# Patient Record
Sex: Male | Born: 1945 | Race: White | Hispanic: No | Marital: Married
Health system: Midwestern US, Community
[De-identification: ages and names within clinical notes are randomized; demographics above are authoritative.]

## PROBLEM LIST (undated history)

## (undated) DIAGNOSIS — K635 Polyp of colon: Secondary | ICD-10-CM

## (undated) DIAGNOSIS — R7989 Other specified abnormal findings of blood chemistry: Secondary | ICD-10-CM

## (undated) DIAGNOSIS — F419 Anxiety disorder, unspecified: Secondary | ICD-10-CM

## (undated) DIAGNOSIS — F32A Depression, unspecified: Secondary | ICD-10-CM

## (undated) DIAGNOSIS — C3490 Malignant neoplasm of unspecified part of unspecified bronchus or lung: Secondary | ICD-10-CM

## (undated) DIAGNOSIS — K8020 Calculus of gallbladder without cholecystitis without obstruction: Secondary | ICD-10-CM

## (undated) DIAGNOSIS — E785 Hyperlipidemia, unspecified: Secondary | ICD-10-CM

## (undated) DIAGNOSIS — C189 Malignant neoplasm of colon, unspecified: Secondary | ICD-10-CM

## (undated) DIAGNOSIS — K219 Gastro-esophageal reflux disease without esophagitis: Secondary | ICD-10-CM

## (undated) DIAGNOSIS — C801 Malignant (primary) neoplasm, unspecified: Secondary | ICD-10-CM

## (undated) DIAGNOSIS — K56609 Unspecified intestinal obstruction, unspecified as to partial versus complete obstruction: Secondary | ICD-10-CM

## (undated) DIAGNOSIS — I10 Essential (primary) hypertension: Secondary | ICD-10-CM

## (undated) DIAGNOSIS — M1990 Unspecified osteoarthritis, unspecified site: Secondary | ICD-10-CM

## (undated) HISTORY — PX: APPENDECTOMY: SHX54

## (undated) HISTORY — DX: Polyp of colon: K63.5

## (undated) HISTORY — PX: CHOLECYSTECTOMY: SHX55

## (undated) HISTORY — DX: Unspecified osteoarthritis, unspecified site: M19.90

## (undated) HISTORY — PX: COLON SURGERY: SHX602

## (undated) HISTORY — DX: Depression, unspecified: F32.A

## (undated) HISTORY — DX: Hyperlipidemia, unspecified: E78.5

## (undated) HISTORY — DX: Malignant neoplasm of colon, unspecified: C18.9

## (undated) HISTORY — DX: Calculus of gallbladder without cholecystitis without obstruction: K80.20

## (undated) HISTORY — PX: ANKLE FUSION: SHX881

## (undated) HISTORY — DX: Gastro-esophageal reflux disease without esophagitis: K21.9

## (undated) HISTORY — DX: Other specified abnormal findings of blood chemistry: R79.89

## (undated) HISTORY — PX: PROSTATE SURGERY: SHX751

## (undated) HISTORY — DX: Malignant neoplasm of unspecified part of unspecified bronchus or lung: C34.90

## (undated) HISTORY — DX: Anxiety disorder, unspecified: F41.9

## (undated) HISTORY — DX: Unspecified intestinal obstruction, unspecified as to partial versus complete obstruction: K56.609

---

## 2002-10-25 DIAGNOSIS — Z85038 Personal history of other malignant neoplasm of large intestine: Secondary | ICD-10-CM | POA: Insufficient documentation

## 2010-09-30 DIAGNOSIS — E538 Deficiency of other specified B group vitamins: Secondary | ICD-10-CM | POA: Insufficient documentation

## 2011-05-24 DIAGNOSIS — C189 Malignant neoplasm of colon, unspecified: Secondary | ICD-10-CM | POA: Insufficient documentation

## 2011-05-24 DIAGNOSIS — C7800 Secondary malignant neoplasm of unspecified lung: Secondary | ICD-10-CM | POA: Insufficient documentation

## 2012-03-08 DIAGNOSIS — E7800 Pure hypercholesterolemia, unspecified: Secondary | ICD-10-CM | POA: Insufficient documentation

## 2017-03-29 ENCOUNTER — Encounter (HOSPITAL_COMMUNITY): Payer: Self-pay

## 2017-03-29 ENCOUNTER — Emergency Department (HOSPITAL_COMMUNITY)
Admission: EM | Admit: 2017-03-29 | Discharge: 2017-03-29 | Disposition: A | Discharge status: Home / Self Care | Payer: Medicare Other | Attending: Emergency Medicine | Admitting: Emergency Medicine

## 2017-03-29 DIAGNOSIS — Z859 Personal history of malignant neoplasm, unspecified: Secondary | ICD-10-CM | POA: Insufficient documentation

## 2017-03-29 DIAGNOSIS — I10 Essential (primary) hypertension: Secondary | ICD-10-CM | POA: Insufficient documentation

## 2017-03-29 DIAGNOSIS — I83811 Varicose veins of right lower extremities with pain: Secondary | ICD-10-CM | POA: Insufficient documentation

## 2017-03-29 DIAGNOSIS — I83899 Varicose veins of unspecified lower extremities with other complications: Secondary | ICD-10-CM

## 2017-03-29 HISTORY — DX: Essential (primary) hypertension: I10

## 2017-03-29 HISTORY — DX: Malignant (primary) neoplasm, unspecified: C80.1

## 2017-03-29 MED ORDER — LIDOCAINE-EPINEPHRINE (PF) 2 %-1:200000 IJ SOLN
10.0000 mL | Freq: Once | INTRAMUSCULAR | Status: AC
  Administered 2017-03-29: 10 mL via INTRADERMAL
  Filled 2017-03-29: qty 20

## 2017-03-29 NOTE — ED Triage Notes (Signed)
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 No pain

## 2017-03-29 NOTE — ED Provider Notes (Signed)
[facility] DEPT Provider Note   CSN: [id number] Arrival date & time: 03/29/17  2129   By signing my name below, I, Jesus [name], attest that this documentation has been prepared under the direction and in the presence of Virgel Manifold, MD . Electronically Signed: Evelene [name], Scribe. 03/29/2017. 9:58 PM.   History   Chief Complaint Chief Complaint  Patient presents with  . Vein bleeding    The history is provided by the patient. No language interpreter was used.     HPI Comments:  Jesus [name] is a 71 y.o. male who presents to the Emergency Department complaining of persistent bleeding from a vein to the right lower leg which began this evening. He states he scratched his leg and thought he scratched a scab but noticed it was profusely bleeding; pt describes squirting of blood from the site. Pt denies h/o similar incident. No alleviating factors or associated symptoms noted.  Past Medical History:  Diagnosis Date  . Cancer ([place])   . Hypertension     There are no active problems to display for this patient.   History reviewed. No pertinent surgical history.     Home Medications    Prior to Admission medications   Not on File    Family History No family history on file.  Social History Social History  Substance Use Topics  . Smoking status: Never Smoker  . Smokeless tobacco: Never Used  . Alcohol use No     Allergies   Patient has no known allergies.   Review of Systems Review of Systems  Constitutional: Negative for chills and fever.  Respiratory: Negative for shortness of breath.   Cardiovascular: Negative for chest pain.  Skin: Positive for wound.  All other systems reviewed and are negative.    Physical Exam Updated Vital Signs SpO2 99%   Physical Exam  Constitutional: He is oriented to person, place, and time. He appears well-developed and well-nourished.  HENT:  Head: Normocephalic and atraumatic.  Eyes: EOM are normal.    Neck: Normal range of motion.  Cardiovascular: Normal rate.   Pulmonary/Chest: Effort normal. No respiratory distress.  Abdominal: He exhibits no distension.  Musculoskeletal: Normal range of motion.  Neurological: He is alert and oriented to person, place, and time.  Skin: Skin is warm and dry.  Continuous flow of blood from a superificial varicosity on the right shin; dark appearing venous blood, non-pulsatile   Psychiatric: He has a normal mood and affect. Judgment normal.  Nursing note and vitals reviewed.    ED Treatments / Results  DIAGNOSTIC STUDIES:  Oxygen Saturation is 99% on RA, normal by my interpretation.    COORDINATION OF CARE:  9:41 PM Discussed treatment plan with pt at bedside and pt agreed to plan.  Labs (all labs ordered are listed, but only abnormal results are displayed) Labs Reviewed - No data to display  EKG  EKG Interpretation None       Radiology No results found.  Procedures .Marland KitchenLaceration Repair Date/Time: 03/29/2017 9:49 PM Performed by: Virgel Manifold Authorized by: Virgel Manifold   Consent:    Consent obtained:  Verbal   Consent given by:  Patient Anesthesia (see MAR for exact dosages):    Anesthesia method:  Local infiltration   Local anesthetic:  Lidocaine 2% WITH epi (2 ccs) Laceration details:    Location:  Leg   Leg location:  R lower leg Skin repair:    Repair method:  Sutures   Suture size:  3-0  Wound skin closure material used: vicryl    Suture technique:  Figure eight   Number of sutures:  1 Post-procedure details:    Patient tolerance of procedure:  Tolerated well, no immediate complications   (including critical care time)  Medications Ordered in ED Medications - No data to display   Initial Impression / Assessment and Plan / ED Course  I have reviewed the triage vital signs and the nursing notes.  Pertinent labs & imaging results that were available during my care of the patient were reviewed by me and  considered in my medical decision making (see chart for details).     21yM with bleeding from superficial varicose vein. Hemostasis by suturing. Wound care and return precautions discussed.   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 The recorded information has been reviewed is accurate. Virgel Manifold, MD.     Virgel Manifold, MD 03/29/17 2206

## 2018-08-02 ENCOUNTER — Encounter: Payer: Self-pay | Admitting: Family Medicine

## 2019-04-16 ENCOUNTER — Telehealth: Payer: Self-pay | Admitting: Adult Health

## 2019-04-16 NOTE — Telephone Encounter (Signed)
Pt would like to have an establish appointment with [facility] but would like to have a face to face declined to do a virtual.  Pt would like to have a call back to get on the books as soon as possible.  Pt state that he would like to have a CPE on the first visit he is aware that we can not garuntee him a CPE on his first visit it is up to the provider.

## 2019-04-17 NOTE — Telephone Encounter (Signed)
Spoke to the pt.  There was some confusion.  Pt would like to establish care with Dr. Yong Channel.  States his brother Galileo Colello) is a pt of Dr. Yong Channel.  Pt is currently moving to [place] from [place].  Notified pt that I will send a message to Dr. Ansel Bong office.

## 2019-04-17 NOTE — Telephone Encounter (Signed)
Patient has been scheduled for 06/18/19 at 11am.

## 2019-04-25 ENCOUNTER — Telehealth: Payer: Self-pay

## 2019-04-25 NOTE — Telephone Encounter (Signed)
Pt is requesting to establish care with Dr. Sarajane Jews. Please advise. Thanks!

## 2019-04-25 NOTE — Telephone Encounter (Signed)
Dr. Sarajane Jews please advise if you are willing to see this pt.  Thanks

## 2019-04-26 NOTE — Telephone Encounter (Signed)
Sorry but I am too full

## 2019-05-11 NOTE — Telephone Encounter (Signed)
Pt has been scheduled with Dr. Yong Channel.

## 2019-06-11 ENCOUNTER — Other Ambulatory Visit: Payer: Self-pay

## 2019-06-11 ENCOUNTER — Telehealth: Payer: Self-pay

## 2019-06-11 NOTE — Telephone Encounter (Signed)
If pt cannot be seen before insurance 06/26/2019, what is the soonest we can work pt in for NP appointment. Look like you have a lot of same day appt. Right now.    Please advise

## 2019-06-11 NOTE — Telephone Encounter (Signed)
While I am willing to accept patient as he is brother of my current patient- I unfortunately am limited in my ability to see new patients due to need to have openings for my current patients.  When is my next available 40-minute slot?  We do have multiple other providers within [facility] who are accepting patients and can see him sooner if he would prefer to establish with another provider.

## 2019-06-11 NOTE — Telephone Encounter (Signed)
Pt is scheduled for NP appt 8/24 but stated his insurance wont start until 9/1. Pt stated he is going to contact insurance and see if they will allow him to be seen earlier with insurance. If not, pt would like to know soonest he can get in for NP appt. Due to the restrictions for NP appt with [facility], wanted to ask when pt could be moved to if needed. Please advise.

## 2019-06-11 NOTE — Telephone Encounter (Signed)
Noted!   Please see note

## 2019-06-18 ENCOUNTER — Ambulatory Visit: Payer: Medicare Other | Admitting: Family Medicine

## 2019-06-27 ENCOUNTER — Other Ambulatory Visit: Payer: Self-pay | Admitting: Emergency Medicine

## 2019-06-27 DIAGNOSIS — R6889 Other general symptoms and signs: Secondary | ICD-10-CM | POA: Diagnosis not present

## 2019-06-27 DIAGNOSIS — D692 Other nonthrombocytopenic purpura: Secondary | ICD-10-CM | POA: Diagnosis not present

## 2019-06-27 DIAGNOSIS — L570 Actinic keratosis: Secondary | ICD-10-CM | POA: Diagnosis not present

## 2019-06-27 DIAGNOSIS — Z20822 Contact with and (suspected) exposure to covid-19: Secondary | ICD-10-CM

## 2019-06-28 ENCOUNTER — Telehealth: Payer: Self-pay | Admitting: Family Medicine

## 2019-06-28 LAB — NOVEL CORONAVIRUS, NAA: SARS-CoV-2, NAA: NOT DETECTED

## 2019-06-28 NOTE — Telephone Encounter (Signed)
Noted  

## 2019-06-28 NOTE — Telephone Encounter (Signed)
Agree with plan - needs doxy visit if having cough and no covid test.  Algis Greenhouse. [name], MD 06/28/2019 4:11 PM

## 2019-06-28 NOTE — Telephone Encounter (Signed)
Patient is scheduled for NP appt tomorrow 06/28/19. States he has a cough that is not bad and is waiting on his covid test results. Patient is asking to please come into the office for his appt if his test results come back negative. Please call patient as soon as possible and advise.Thank you!

## 2019-06-28 NOTE — Telephone Encounter (Signed)
FYI  Spoke to pt and advised that he will be doing a televisit. Pt was also informed that test results were not in yet. Pt stated that his friend got his test results back today and was neg so he's should be neg. Pt was advised that he would have to test neg and bc his cough was 2-3 days pt will have to do a Doxy.me. Pt stated " I've played golf and went to several doctors appts this week so my temp was checked!". Pt stated I" I do not have a temp so I should be good" I advised pt that he may not develop all the sx and also to self quarantine. Pt stated that no one told him to self quarantine and was a little upset about the appointment situation. Pt was stated that he decided to get the test on his own w/o D.O." just to have it done and have a base line". I advise pt again that bc he now has a cough that a telephone visit will be appropriate. Pt stated that he will keep appt. And wait for Dr. Marigene Ehlers call.

## 2019-06-29 ENCOUNTER — Ambulatory Visit (INDEPENDENT_AMBULATORY_CARE_PROVIDER_SITE_OTHER): Payer: Medicare Other | Admitting: Family Medicine

## 2019-06-29 ENCOUNTER — Other Ambulatory Visit (INDEPENDENT_AMBULATORY_CARE_PROVIDER_SITE_OTHER): Payer: Medicare Other

## 2019-06-29 ENCOUNTER — Other Ambulatory Visit: Payer: Self-pay

## 2019-06-29 ENCOUNTER — Encounter: Payer: Self-pay | Admitting: Family Medicine

## 2019-06-29 VITALS — BP 142/84 | Temp 97.5°F | Ht 71.5 in | Wt 200.0 lb

## 2019-06-29 DIAGNOSIS — K2100 Gastro-esophageal reflux disease with esophagitis, without bleeding: Secondary | ICD-10-CM

## 2019-06-29 DIAGNOSIS — C20 Malignant neoplasm of rectum: Secondary | ICD-10-CM

## 2019-06-29 DIAGNOSIS — C3490 Malignant neoplasm of unspecified part of unspecified bronchus or lung: Secondary | ICD-10-CM

## 2019-06-29 DIAGNOSIS — E785 Hyperlipidemia, unspecified: Secondary | ICD-10-CM | POA: Insufficient documentation

## 2019-06-29 DIAGNOSIS — I10 Essential (primary) hypertension: Secondary | ICD-10-CM | POA: Diagnosis not present

## 2019-06-29 DIAGNOSIS — Z125 Encounter for screening for malignant neoplasm of prostate: Secondary | ICD-10-CM | POA: Diagnosis not present

## 2019-06-29 DIAGNOSIS — Z79899 Other long term (current) drug therapy: Secondary | ICD-10-CM

## 2019-06-29 DIAGNOSIS — K219 Gastro-esophageal reflux disease without esophagitis: Secondary | ICD-10-CM | POA: Insufficient documentation

## 2019-06-29 DIAGNOSIS — F419 Anxiety disorder, unspecified: Secondary | ICD-10-CM

## 2019-06-29 LAB — COMPREHENSIVE METABOLIC PANEL
ALT: 20 U/L (ref 0–53)
AST: 21 U/L (ref 0–37)
Albumin: 3.7 g/dL (ref 3.5–5.2)
Alkaline Phosphatase: 64 U/L (ref 39–117)
BUN: 7 mg/dL (ref 6–23)
CO2: 33 mEq/L — ABNORMAL HIGH (ref 19–32)
Calcium: 9.2 mg/dL (ref 8.4–10.5)
Chloride: 88 mEq/L — ABNORMAL LOW (ref 96–112)
Creatinine, Ser: 0.76 mg/dL (ref 0.40–1.50)
GFR: 100.65 mL/min (ref >60.00)
Glucose, Bld: 116 mg/dL — ABNORMAL HIGH (ref 70–99)
Potassium: 4.6 mEq/L (ref 3.5–5.1)
Sodium: 131 mEq/L — ABNORMAL LOW (ref 135–145)
Total Bilirubin: 0.7 mg/dL (ref 0.2–1.2)
Total Protein: 6.5 g/dL (ref 6.0–8.3)

## 2019-06-29 LAB — LIPID PANEL
Cholesterol: 109 mg/dL (ref 0–200)
HDL: 46.7 mg/dL (ref >39.00)
LDL Cholesterol: 49 mg/dL (ref 0–99)
NonHDL: 62.18
Total CHOL/HDL Ratio: 2
Triglycerides: 67 mg/dL (ref 0.0–149.0)
VLDL: 13.4 mg/dL (ref 0.0–40.0)

## 2019-06-29 LAB — PSA, MEDICARE: PSA: 0.98 ng/ml (ref 0.10–4.00)

## 2019-06-29 LAB — CBC
HCT: 41 % (ref 39.0–52.0)
Hemoglobin: 14.2 g/dL (ref 13.0–17.0)
MCHC: 34.6 g/dL (ref 30.0–36.0)
MCV: 100.1 fl — ABNORMAL HIGH (ref 78.0–100.0)
Platelets: 288 10*3/uL (ref 150.0–400.0)
RBC: 4.1 Mil/uL — ABNORMAL LOW (ref 4.22–5.81)
RDW: 12.7 % (ref 11.5–15.5)
WBC: 11 10*3/uL — ABNORMAL HIGH (ref 4.0–10.5)

## 2019-06-29 LAB — TSH: TSH: 0.27 u[IU]/mL — ABNORMAL LOW (ref 0.35–4.50)

## 2019-06-29 LAB — VITAMIN B12: Vitamin B-12: >1500 pg/mL — ABNORMAL HIGH (ref 211–911)

## 2019-06-29 NOTE — Assessment & Plan Note (Signed)
No signs of recurrence.  Due for repeat colonoscopy 2022.

## 2019-06-29 NOTE — Assessment & Plan Note (Signed)
Continue omeprazole 20 mg daily.  Check CBC, C met, TSH, and B12.

## 2019-06-29 NOTE — Assessment & Plan Note (Signed)
Will need yearly CT scan soon.

## 2019-06-29 NOTE — Assessment & Plan Note (Signed)
Continue Lipitor 10 mg daily.  Check lipid panel next blood draw.

## 2019-06-29 NOTE — Progress Notes (Signed)
Chief Complaint:  Jesus [name] is a 73 y.o. male who presents today for a virtual office visit with a chief complaint of Essential hypetension.   Assessment/Plan:  Essential hypertension Continue HCTZ 25 mg daily and losartan 50 mg daily.  Will come in soon for physical with blood work.  Recheck blood pressure at that time.  Check CBC, C met, and TSH.  Dyslipidemia Continue Lipitor 10 mg daily.  Check lipid panel next blood draw.  GERD (gastroesophageal reflux disease) Continue omeprazole 20 mg daily.  Check CBC, C met, TSH, and B12.  Anxiety Continue Xanax 0.5 mg twice daily as needed.  Rectal cancer ([place]) No signs of recurrence.  Due for repeat colonoscopy 2022.  Lung cancer [facility]) s/p resection in 2010 Will need yearly CT scan soon.  Preventative Healthcare Patient was instructed to return soon for CPE. Check PSA with blood draw. Health Maintenance Due  Topic Date Due  . Hepatitis C Screening  [birth year]  . COLONOSCOPY  11/24/1996     Subjective:  HPI:  His stable, chronic medical conditions are outlined below:  # Essential Hypertension - On HCTZ 32m daily and losartan 573mdaily. Tolerating well without side effects - ROS: No reported chest or shortness of breath  # Dyslipidemia - On lipitor 203maily and tolerating well - ROS: No reported myalgias  # GERD - On prilosec 29m25mily and doing well  # Anxiety - On xanax 0.5mg 71mce daily as needed - Tolerating well without side effects - ROS: No history of SI or HI  # Rectal Cancer s/p resection ~2000 - Due for colonoscopy in 2022  # Lung Cancer s/p resection ~2010 - Gets yearly CT scan - Has occasional dry cough  ROS: Per HPI, otherwise a complete review of systems was negative.   PMH:  The following were reviewed and entered/updated in epic: Past Medical History:  Diagnosis Date  . Arthritis   . Cancer (HCC) [place] Colon cancer (HCC) [place]2000  . Colon polyp   . GERD  (gastroesophageal reflux disease)   . Hyperlipidemia   . Hypertension   . Lung cancer (HCC)[facility]   Patient Active Problem List   Diagnosis Date Noted  . Essential hypertension 06/29/2019  . Dyslipidemia 06/29/2019  . GERD (gastroesophageal reflux disease) 06/29/2019  . Anxiety 06/29/2019  . Lung cancer (HCC)[facility] resection in 2010 06/29/2019  [facility]:  Procedure Laterality Date  . ANKLE FUSION     crushed ankle while in high school  . APPENDECTOMY     1960's    Family History  Problem Relation Age of Onset  . Heart disease Father   . Breast cancer Sister   . Breast cancer Daughter   . Prostate cancer Neg Hx     Medications- reviewed and updated Current Outpatient Medications  Medication Sig Dispense Refill  . ALPRAZolam (XANAX) 0.5 MG tablet Take 1 tablet by mouth 2 (two) times daily as needed.    . ApoMarland Kitchenequorin (PREVAGEN PO) Take by mouth.    . atoMarland Kitchenvastatin (LIPITOR) 20 MG tablet Take 1 tablet by mouth daily.    . B Complex Vitamins (B COMPLEX PO) Take by mouth.    . Cyanocobalamin (VITAMIN B-12 PO) Take by mouth.    . hydrochlorothiazide (HYDRODIURIL) 25 MG tablet Take 1 tablet by mouth daily.    . losMarland Kitchenrtan (COZAAR) 50 MG tablet Take 1 tablet by  mouth daily.    . Multiple Vitamin (MULTIVITAMIN PO) Take by mouth.    . Multiple Vitamins-Minerals (ZINC PO) Take by mouth.    Marland Kitchen omeprazole (PRILOSEC) 20 MG capsule Take 1 capsule by mouth daily.    Marland Kitchen VITAMIN D PO Take 800 Units by mouth.      No current facility-administered medications for this visit.     Allergies-reviewed and updated No Known Allergies  Social History   Socioeconomic History  . Marital status: Married    Spouse name: Not on file  . Number of children: Not on file  . Years of education: Not on file  . Highest education level: Not on file  Occupational History  . Not on file  Social Needs  . Financial resource strain: Not on file  . Food insecurity     Worry: Not on file    Inability: Not on file  . Transportation needs    Medical: Not on file    Non-medical: Not on file  Tobacco Use  . Smoking status: Former Smoker    Quit date: 2010    Years since quitting: 10.6  . Smokeless tobacco: Never Used  Substance and Sexual Activity  . Alcohol use: Yes  . Drug use: No  . Sexual activity: Not on file  Lifestyle  . Physical activity    Days per week: 3 days    Minutes per session: Not on file  . Stress: Not on file  Relationships  . Social Herbalist on phone: Not on file    Gets together: Not on file    Attends religious service: Not on file    Active member of club or organization: Not on file    Attends meetings of clubs or organizations: Not on file    Relationship status: Not on file  Other Topics Concern  . Not on file  Social History Narrative  . Not on file          Objective/Observations  Physical Exam: Gen: NAD, resting comfortably HEENT: EOMI. No scleral icterus CV: No peripheral edema Pulm: Normal work of breathing MSK: No digitial cyanosis. Neck with FROM Skin: No rashes or lesions Neuro: Grossly normal, moves all extremities Psych: Normal affect and thought content  Virtual Visit via Video   I connected with Jesus [name] on 06/29/19 at  9:40 AM EDT by a video enabled telemedicine application and verified that I am speaking with the correct person using two identifiers. I discussed the limitations of evaluation and management by telemedicine and the availability of in person appointments. The patient expressed understanding and agreed to proceed.   Patient location: Home Provider location: [place] participating in the virtual visit: Myself and Patient     Algis Greenhouse. [name], MD 06/29/2019 10:26 AM

## 2019-06-29 NOTE — Assessment & Plan Note (Signed)
Continue Xanax 0.5 mg twice daily as needed.

## 2019-06-29 NOTE — Assessment & Plan Note (Signed)
Continue HCTZ 25 mg daily and losartan 50 mg daily.  Will come in soon for physical with blood work.  Recheck blood pressure at that time.  Check CBC, C met, and TSH.

## 2019-07-03 NOTE — Progress Notes (Signed)
Please inform patient of the following:  Thyroid level is just a bit off. His sodium level is low - I think this is probably due to his HCTZ.  Everything else is within acceptable ranges.  Recommend that he come back in 4-6 weeks to recheck TSH and BMET.  Do not need to make any medication changes today. Please place future orders.   Algis Greenhouse. [name], MD 07/03/2019 8:25 AM

## 2019-07-05 ENCOUNTER — Telehealth: Payer: Self-pay | Admitting: Physical Therapy

## 2019-07-05 ENCOUNTER — Encounter: Payer: Self-pay | Admitting: Family Medicine

## 2019-07-05 ENCOUNTER — Other Ambulatory Visit: Payer: Self-pay | Admitting: Family Medicine

## 2019-07-05 ENCOUNTER — Ambulatory Visit (INDEPENDENT_AMBULATORY_CARE_PROVIDER_SITE_OTHER): Payer: Medicare Other | Admitting: Family Medicine

## 2019-07-05 DIAGNOSIS — K2100 Gastro-esophageal reflux disease with esophagitis, without bleeding: Secondary | ICD-10-CM

## 2019-07-05 DIAGNOSIS — R059 Cough, unspecified: Secondary | ICD-10-CM

## 2019-07-05 DIAGNOSIS — R05 Cough: Secondary | ICD-10-CM | POA: Diagnosis not present

## 2019-07-05 MED ORDER — PANTOPRAZOLE SODIUM 40 MG PO TBEC: 40.0000 mg | DELAYED_RELEASE_TABLET | Freq: Two times a day (BID) | ORAL | 1 refills | Status: DC

## 2019-07-05 MED ORDER — AZITHROMYCIN 250 MG PO TABS
ORAL_TABLET | ORAL | 0 refills | Status: DC
Start: 2019-07-05 — End: 2019-07-30

## 2019-07-05 MED ORDER — GUAIFENESIN-CODEINE 100-10 MG/5ML PO SOLN: 5.0000 mL | Freq: Three times a day (TID) | ORAL | 0 refills | Status: DC | PRN

## 2019-07-05 NOTE — Progress Notes (Signed)
    Chief Complaint:  Jesus Jesus [name] is a 73 y.o. male who presents today for a virtual office visit with a chief complaint of cough.   Assessment/Plan:  Cough Tested for COVID a week ago which was negative.  History not consistent with infectious etiology.  Does have some worsening reflux symptoms which could be the main contributor.  We will start Protonix 40 mg twice daily for the next couple of weeks.  He will also continue taking Claritin.  Also send in guaifenesin-codeine for his cough.  Also send in a "pocket prescription" for azithromycin with strict instruction not start in the symptoms worsen or not improve next few days.  Discussed reasons to return to care.  If symptoms not improved will likely need chest x-ray.  GERD (gastroesophageal reflux disease) Uncontrolled.  Change omeprazole to Protonix 40 mg twice daily for 2 weeks.     Subjective:  HPI:  Cough Started about 7 to 10 days ago.  Tried taking Delsym and emergency with modest improvement.  Symptoms been stable over the last couple of days.  Cough is productive of some mucus.  Jesus [name] fevers or chills.  She had some decreased appetite.  Decreased energy levels.  Is able to play 9 holes of golf however is typically able to play 18 holes of golf.  Jesus [name] known sick contacts.  Jesus [name] other treatments tried.  Jesus [name] other obvious alleviating or aggravating factors.  Has had some worsening with reflux over the past couple of weeks.  Is currently taking omeprazole 20 mg daily however does not think that this is very effective.  ROS: Per HPI  PMH: He reports that he quit smoking about 10 years ago. He has never used smokeless tobacco. He reports current alcohol use. He reports that he does not use drugs.      Objective/Observations  Physical Exam: Gen: NAD, resting comfortably Pulm: Normal work of breathing Neuro: Grossly normal, moves all extremities Psych: Normal affect and thought content  Jesus [name] results found for this or any previous  visit (from the past 24 hour(s)).   Virtual Visit via Video   I connected with Jesus Jesus [name] on 07/05/19 at 10:40 AM EDT by a video enabled telemedicine application and verified that I am speaking with the correct person using two identifiers. I discussed the limitations of evaluation and management by telemedicine and the availability of in person appointments. The patient expressed understanding and agreed to proceed.   Patient location: Home Provider location: [place] participating in the virtual visit: Myself and Patient     Algis Greenhouse. [name], MD 07/05/2019 9:22 AM

## 2019-07-05 NOTE — Telephone Encounter (Signed)
Notified pharmacy of Dx code.

## 2019-07-05 NOTE — Telephone Encounter (Signed)
Copied from Home [phone]. Topic: General - Other >> Jul 05, 2019  9:24 AM Carolyn Stare wrote: Pharmacy called and they can not fill the below med without a diag code, please contact the pharmacy spoke with Rob   azithromycin (ZITHROMAX) 250 MG tablet

## 2019-07-05 NOTE — Assessment & Plan Note (Signed)
Uncontrolled.  Change omeprazole to Protonix 40 mg twice daily for 2 weeks.

## 2019-07-09 ENCOUNTER — Other Ambulatory Visit: Payer: Medicare Other

## 2019-07-09 ENCOUNTER — Other Ambulatory Visit: Payer: Self-pay

## 2019-07-09 ENCOUNTER — Ambulatory Visit (INDEPENDENT_AMBULATORY_CARE_PROVIDER_SITE_OTHER): Payer: Medicare Other

## 2019-07-09 ENCOUNTER — Telehealth: Payer: Self-pay

## 2019-07-09 DIAGNOSIS — R059 Cough, unspecified: Secondary | ICD-10-CM

## 2019-07-09 DIAGNOSIS — R05 Cough: Secondary | ICD-10-CM

## 2019-07-09 DIAGNOSIS — R946 Abnormal results of thyroid function studies: Secondary | ICD-10-CM

## 2019-07-09 DIAGNOSIS — E871 Hypo-osmolality and hyponatremia: Secondary | ICD-10-CM

## 2019-07-09 NOTE — Telephone Encounter (Signed)
Noted  

## 2019-07-09 NOTE — Telephone Encounter (Signed)
Copied from [place] [phone]. Topic: General - Other >> Jul 09, 2019  9:16 AM Carolyn Stare wrote: Pt call to say he would like to have a chest xray as per his conversation  with Dr [name]

## 2019-07-10 ENCOUNTER — Other Ambulatory Visit: Payer: Self-pay

## 2019-07-10 ENCOUNTER — Telehealth: Payer: Self-pay | Admitting: *Deleted

## 2019-07-10 DIAGNOSIS — R059 Cough, unspecified: Secondary | ICD-10-CM

## 2019-07-10 DIAGNOSIS — R05 Cough: Secondary | ICD-10-CM

## 2019-07-10 NOTE — Telephone Encounter (Signed)
See note

## 2019-07-10 NOTE — Progress Notes (Signed)
Please inform patient of the following:  Chest xray shows some fullness in the lining of his lung. Radiology recommend that we get a CT scan to further evaluate. Would like for him to go ahead and get this scheduled. Please place order for chest CT with contrast.  Emmilia Sowder M. [name], MD 07/10/2019 1:13 PM

## 2019-07-10 NOTE — Telephone Encounter (Signed)
Radiology called with cxray results. Now available in Epic. Routing to PCP.

## 2019-07-23 ENCOUNTER — Encounter: Payer: Medicare Other | Admitting: Family Medicine

## 2019-07-23 ENCOUNTER — Telehealth: Payer: Self-pay | Admitting: Physical Therapy

## 2019-07-23 ENCOUNTER — Other Ambulatory Visit: Payer: Self-pay | Admitting: Family Medicine

## 2019-07-23 MED ORDER — LOSARTAN POTASSIUM 50 MG PO TABS: 50.0000 mg | ORAL_TABLET | Freq: Every day | ORAL | 1 refills | Status: DC

## 2019-07-23 NOTE — Telephone Encounter (Signed)
Copied from [place] [phone]. Topic: General - Inquiry >> Jul 23, 2019  9:23 AM [name] wrote: Reason for CRM: Pt called to follow up on CT he is supposed to have. He has not been contacted to schedule it. Please advise.

## 2019-07-23 NOTE — Telephone Encounter (Signed)
Requested medication (s) are due for refill today: yes  Requested medication (s) are on the active medication list: yes  Last refill:  06/11/2019  Future visit scheduled:yes  Notes to clinic:  Review for refill   Requested Prescriptions  Pending Prescriptions Disp Refills   losartan (COZAAR) 50 MG tablet       Sig: Take 1 tablet (50 mg total) by mouth daily.     Cardiovascular:  Angiotensin Receptor Blockers Failed - 07/23/2019  9:34 AM      Failed - Last BP in normal range    BP Readings from Last 1 Encounters:  06/29/19 (!) 142/84         Passed - Cr in normal range and within 180 days    Creatinine, Ser  Date Value Ref Range Status  06/29/2019 0.76 0.40 - 1.50 mg/dL Final         Passed - K in normal range and within 180 days    Potassium  Date Value Ref Range Status  06/29/2019 4.6 3.5 - 5.1 mEq/L Final         Passed - Patient is not pregnant      Passed - Valid encounter within last 6 months    Recent Outpatient Visits          2 weeks ago Cough   [facility] Bread, Algis Greenhouse, MD   3 weeks ago Essential hypertension   [facility], Algis Greenhouse, MD      Future Appointments            In 1 week [name], Algis Greenhouse, MD [place], [facility]

## 2019-07-23 NOTE — Telephone Encounter (Signed)
Please assist. Thanks.

## 2019-07-23 NOTE — Telephone Encounter (Signed)
See request °

## 2019-07-23 NOTE — Telephone Encounter (Signed)
Medication Refill - Medication:   losartan (COZAAR) 50 MG tablet   Has not been filled by Dr. [name] before.  Has the patient contacted their pharmacy? No. (Agent: If no, request that the patient contact the pharmacy for the refill.) (Agent: If yes, when and what did the pharmacy advise?)  Preferred Pharmacy (with phone number or street name):  [id number] - [place], [address] [phone] (Phone) [phone] (Fax)     Agent: Please be advised that RX refills may take up to 3 business days. We ask that you follow-up with your pharmacy.

## 2019-07-27 ENCOUNTER — Other Ambulatory Visit: Payer: Self-pay

## 2019-07-27 ENCOUNTER — Ambulatory Visit
Admission: RE | Admit: 2019-07-27 | Discharge: 2019-07-27 | Disposition: A | Discharge status: Home / Self Care | Payer: Medicare Other | Source: Ambulatory Visit | Attending: Family Medicine | Admitting: Family Medicine

## 2019-07-27 DIAGNOSIS — R05 Cough: Secondary | ICD-10-CM

## 2019-07-27 DIAGNOSIS — R918 Other nonspecific abnormal finding of lung field: Secondary | ICD-10-CM | POA: Diagnosis not present

## 2019-07-27 DIAGNOSIS — R059 Cough, unspecified: Secondary | ICD-10-CM

## 2019-07-27 MED ORDER — IOPAMIDOL (ISOVUE-300) INJECTION 61%
75.0000 mL | Freq: Once | INTRAVENOUS | Status: AC | PRN
  Administered 2019-07-27: 75 mL via INTRAVENOUS

## 2019-07-27 NOTE — Progress Notes (Signed)
Please inform patient of the following:  Ct confirms that he has a fullness in his lungs. Recommend that he follow up with pulmonology to discuss next steps. Please place referral. Recommend OV to discuss further if patient has questions.  Algis Greenhouse. [name], MD 07/27/2019 3:35 PM

## 2019-07-30 ENCOUNTER — Ambulatory Visit (INDEPENDENT_AMBULATORY_CARE_PROVIDER_SITE_OTHER): Payer: Medicare Other | Admitting: Family Medicine

## 2019-07-30 ENCOUNTER — Encounter: Payer: Self-pay | Admitting: Family Medicine

## 2019-07-30 ENCOUNTER — Other Ambulatory Visit: Payer: Self-pay

## 2019-07-30 VITALS — BP 138/76 | HR 71 | Temp 98.2°F | Ht 71.5 in | Wt 198.2 lb

## 2019-07-30 DIAGNOSIS — R7989 Other specified abnormal findings of blood chemistry: Secondary | ICD-10-CM | POA: Diagnosis not present

## 2019-07-30 DIAGNOSIS — C3490 Malignant neoplasm of unspecified part of unspecified bronchus or lung: Secondary | ICD-10-CM

## 2019-07-30 DIAGNOSIS — E785 Hyperlipidemia, unspecified: Secondary | ICD-10-CM

## 2019-07-30 DIAGNOSIS — C20 Malignant neoplasm of rectum: Secondary | ICD-10-CM

## 2019-07-30 DIAGNOSIS — Z0001 Encounter for general adult medical examination with abnormal findings: Secondary | ICD-10-CM

## 2019-07-30 DIAGNOSIS — Z23 Encounter for immunization: Secondary | ICD-10-CM

## 2019-07-30 DIAGNOSIS — E871 Hypo-osmolality and hyponatremia: Secondary | ICD-10-CM

## 2019-07-30 DIAGNOSIS — F419 Anxiety disorder, unspecified: Secondary | ICD-10-CM

## 2019-07-30 DIAGNOSIS — I10 Essential (primary) hypertension: Secondary | ICD-10-CM | POA: Diagnosis not present

## 2019-07-30 DIAGNOSIS — E663 Overweight: Secondary | ICD-10-CM

## 2019-07-30 DIAGNOSIS — Z6827 Body mass index (BMI) 27.0-27.9, adult: Secondary | ICD-10-CM

## 2019-07-30 LAB — BASIC METABOLIC PANEL
BUN: 9 mg/dL (ref 6–23)
CO2: 29 mEq/L (ref 19–32)
Calcium: 10.1 mg/dL (ref 8.4–10.5)
Chloride: 93 mEq/L — ABNORMAL LOW (ref 96–112)
Creatinine, Ser: 0.69 mg/dL (ref 0.40–1.50)
GFR: 112.5 mL/min (ref >60.00)
Glucose, Bld: 93 mg/dL (ref 70–99)
Potassium: 4.9 mEq/L (ref 3.5–5.1)
Sodium: 132 mEq/L — ABNORMAL LOW (ref 135–145)

## 2019-07-30 LAB — T4, FREE: Free T4: 0.84 ng/dL (ref 0.60–1.60)

## 2019-07-30 LAB — TSH: TSH: 0.81 u[IU]/mL (ref 0.35–4.50)

## 2019-07-30 LAB — T3, FREE: T3, Free: 3.1 pg/mL (ref 2.3–4.2)

## 2019-07-30 NOTE — Assessment & Plan Note (Signed)
Stable.  Continue Xanax 0.5 mg twice daily as needed.

## 2019-07-30 NOTE — Patient Instructions (Signed)
It was very nice to see you today!  We will refer you to see the pulmonologist.  We will repeat some blood work today.  No medication changes today.  Please come back to see me in 12 months for your annual physical, or sooner if needed.  Take care, Dr [name]  Please try these tips to maintain a healthy lifestyle:   Eat at least 3 REAL meals and 1-2 snacks per day.  Aim for no more than 5 hours between eating.  If you eat breakfast, please do so within one hour of getting up.    Obtain twice as many fruits/vegetables as protein or carbohydrate foods for both lunch and dinner. (Half of each meal should be fruits/vegetables, one quarter protein, and one quarter starchy carbs)   Cut down on sweet beverages. This includes juice, soda, and sweet tea.    Exercise at least 150 minutes every week.    Preventive Care 24 Years and Older, Male Preventive care refers to lifestyle choices and visits with your health care provider that can promote health and wellness. This includes:  A yearly physical exam. This is also called an annual well check.  Regular dental and eye exams.  Immunizations.  Screening for certain conditions.  Healthy lifestyle choices, such as diet and exercise. What can I expect for my preventive care visit? Physical exam Your health care provider will check:  Height and weight. These may be used to calculate body mass index (BMI), which is a measurement that tells if you are at a healthy weight.  Heart rate and blood pressure.  Your skin for abnormal spots. Counseling Your health care provider may ask you questions about:  Alcohol, tobacco, and drug use.  Emotional well-being.  Home and relationship well-being.  Sexual activity.  Eating habits.  History of falls.  Memory and ability to understand (cognition).  Work and work Statistician. What immunizations do I need?  Influenza (flu) vaccine  This is recommended every year. Tetanus,  diphtheria, and pertussis (Tdap) vaccine  You may need a Td booster every 10 years. Varicella (chickenpox) vaccine  You may need this vaccine if you have not already been vaccinated. Zoster (shingles) vaccine  You may need this after age 76. Pneumococcal conjugate (PCV13) vaccine  One dose is recommended after age 31. Pneumococcal polysaccharide (PPSV23) vaccine  One dose is recommended after age 55. Measles, mumps, and rubella (MMR) vaccine  You may need at least one dose of MMR if you were born in 1957 or later. You may also need a second dose. Meningococcal conjugate (MenACWY) vaccine  You may need this if you have certain conditions. Hepatitis A vaccine  You may need this if you have certain conditions or if you travel or work in places where you may be exposed to hepatitis A. Hepatitis B vaccine  You may need this if you have certain conditions or if you travel or work in places where you may be exposed to hepatitis B. Haemophilus influenzae type b (Hib) vaccine  You may need this if you have certain conditions. You may receive vaccines as individual doses or as more than one vaccine together in one shot (combination vaccines). Talk with your health care provider about the risks and benefits of combination vaccines. What tests do I need? Blood tests  Lipid and cholesterol levels. These may be checked every 5 years, or more frequently depending on your overall health.  Hepatitis C test.  Hepatitis B test. Screening  Lung cancer screening.  You may have this screening every year starting at age 31 if you have a 30-pack-year history of smoking and currently smoke or have quit within the past 15 years.  Colorectal cancer screening. All adults should have this screening starting at age 30 and continuing until age 29. Your health care provider may recommend screening at age 3 if you are at increased risk. You will have tests every 1-10 years, depending on your results and  the type of screening test.  Prostate cancer screening. Recommendations will vary depending on your family history and other risks.  Diabetes screening. This is done by checking your blood sugar (glucose) after you have not eaten for a while (fasting). You may have this done every 1-3 years.  Abdominal aortic aneurysm (AAA) screening. You may need this if you are a current or former smoker.  Sexually transmitted disease (STD) testing. Follow these instructions at home: Eating and drinking  Eat a diet that includes fresh fruits and vegetables, whole grains, lean protein, and low-fat dairy products. Limit your intake of foods with high amounts of sugar, saturated fats, and salt.  Take vitamin and mineral supplements as recommended by your health care provider.  Do not drink alcohol if your health care provider tells you not to drink.  If you drink alcohol: ? Limit how much you have to 0-2 drinks a day. ? Be aware of how much alcohol is in your drink. In the [place] one drink equals one 12 oz bottle of beer (355 mL), one 5 oz glass of wine (148 mL), or one 1 oz glass of hard liquor (44 mL). Lifestyle  Take daily care of your teeth and gums.  Stay active. Exercise for at least 30 minutes on 5 or more days each week.  Do not use any products that contain nicotine or tobacco, such as cigarettes, e-cigarettes, and chewing tobacco. If you need help quitting, ask your health care provider.  If you are sexually active, practice safe sex. Use a condom or other form of protection to prevent STIs (sexually transmitted infections).  Talk with your health care provider about taking a low-dose aspirin or statin. What's next?  Visit your health care provider once a year for a well check visit.  Ask your health care provider how often you should have your eyes and teeth checked.  Stay up to date on all vaccines. This information is not intended to replace advice given to you by your health care  provider. Make sure you discuss any questions you have with your health care provider. Document Released: 11/07/2015 Document Revised: 10/05/2018 Document Reviewed: 10/05/2018 Elsevier Patient Education  2020 [employer].

## 2019-07-30 NOTE — Assessment & Plan Note (Signed)
Last LDL 49.  Continue Lipitor 20 mg daily.

## 2019-07-30 NOTE — Assessment & Plan Note (Signed)
Due for repeat colonoscopy 2022.

## 2019-07-30 NOTE — Progress Notes (Signed)
Chief Complaint:  Jesus [name] is a 73 y.o. male who presents today for his annual comprehensive physical exam.    Assessment/Plan:  Lung cancer ([place]) s/p resection in 2010 Recently had CT scan done for ongoing cough.  Was found to have left lobe mass.  Unsure if this represents scar tissue or new malignancy.  He has referral to pulmonology pending.  Currently does not have any symptoms.  His cough is resolved.  Anxiety Stable.  Continue Xanax 0.5 mg twice daily as needed.  Dyslipidemia Last LDL 49.  Continue Lipitor 20 mg daily.  Essential hypertension At goal.  Continue HCTZ 25 mg daily and losartan 50 mg daily.  Rectal cancer [facility]) Due for repeat colonoscopy 2022.  Low TSH Recheck TSH, check T4 and T3  Low sodium Likely due to HCTZ.  Recheck be met today.  Body mass index is 27.26 kg/m. / Overweight    Preventative Healthcare: Due for colonoscopy next year.  Last had a done 4 years ago.  Most recent PSA normal.  Up-to-date on other vaccines and screenings.  Patient Counseling(The following topics were reviewed and/or handout was given):  -Nutrition: Stressed importance of moderation in sodium/caffeine intake, saturated fat and cholesterol, caloric balance, sufficient intake of fresh fruits, vegetables, and fiber.  -Stressed the importance of regular exercise.   -Substance Abuse: Discussed cessation/primary prevention of tobacco, alcohol, or other drug use; driving or other dangerous activities under the influence; availability of treatment for abuse.   -Injury prevention: Discussed safety belts, safety helmets, smoke detector, smoking near bedding or upholstery.   -Sexuality: Discussed sexually transmitted diseases, partner selection, use of condoms, avoidance of unintended pregnancy and contraceptive alternatives.   -Dental health: Discussed importance of regular tooth brushing, flossing, and dental visits.  -Health maintenance and immunizations reviewed. Please  refer to Health maintenance section.  Return to care in 1 year for next preventative visit.     Subjective:  HPI:  He has no acute complaints today.   His stable, chronic medical conditions are outlined below:   # Essential Hypertension - On HCTZ 36m daily and losartan 555mdaily. Tolerating well without side effects - ROS: No reported chest or shortness of breath  # Dyslipidemia - On lipitor 2033maily and tolerating well - ROS: No reported myalgias  # GERD - On prilosec 29m39mily and doing well  # Anxiety - On xanax 0.5mg 33mce daily as needed - Tolerating well without side effects - ROS: No history of SI or HI  # Rectal Cancer s/p resection ~2000 - Due for colonoscopy in 2022  # Lung Cancer s/p resection ~2010 - Gets yearly CT scan - Has occasional dry cough  Lifestyle Diet: No specific diets or eating plans. Exercise: Likes to play golf several times per week.  Depression screen PHQ 2/9 06/29/2019  Decreased Interest 0  Down, Depressed, Hopeless 0  PHQ - 2 Score 0    Health Maintenance Due  Topic Date Due  . Hepatitis C Screening  10/3099/22/1948OLONOSCOPY  11/24/1996     ROS: Per HPI, otherwise a complete review of systems was negative.   PMH:  The following were reviewed and entered/updated in epic: Past Medical History:  Diagnosis Date  . Arthritis   . Cancer (HCC) [place] Colon cancer (HCC) [place]2000  . Colon polyp   . GERD (gastroesophageal reflux disease)   . Hyperlipidemia   . Hypertension   . Lung cancer (HCC)[place]  2010   Patient Active Problem List   Diagnosis Date Noted  . Essential hypertension 06/29/2019  . Dyslipidemia 06/29/2019  . GERD (gastroesophageal reflux disease) 06/29/2019  . Anxiety 06/29/2019  . Lung cancer [facility]) s/p resection in 2010 06/29/2019  . Rectal cancer [facility])    Past Surgical History:  Procedure Laterality Date  . ANKLE FUSION     crushed ankle while in high school  . APPENDECTOMY     1960's     Family History  Problem Relation Age of Onset  . Heart disease Father   . Breast cancer Sister   . Breast cancer Daughter   . Prostate cancer Neg Hx     Medications- reviewed and updated Current Outpatient Medications  Medication Sig Dispense Refill  . ALPRAZolam (XANAX) 0.5 MG tablet Take 1 tablet by mouth 2 (two) times daily as needed.    Marland Kitchen Apoaequorin (PREVAGEN PO) Take by mouth.    Marland Kitchen atorvastatin (LIPITOR) 20 MG tablet Take 1 tablet by mouth daily.    . B Complex Vitamins (B COMPLEX PO) Take by mouth.    . Cyanocobalamin (VITAMIN B-12 PO) Take by mouth.    . hydrochlorothiazide (HYDRODIURIL) 25 MG tablet Take 1 tablet by mouth daily.    Marland Kitchen losartan (COZAAR) 50 MG tablet Take 1 tablet (50 mg total) by mouth daily. 90 tablet 1  . Multiple Vitamin (MULTIVITAMIN PO) Take by mouth.    . Multiple Vitamins-Minerals (ZINC PO) Take by mouth.    Marland Kitchen VITAMIN D PO Take 800 Units by mouth.      No current facility-administered medications for this visit.     Allergies-reviewed and updated No Known Allergies  Social History   Socioeconomic History  . Marital status: Married    Spouse name: Not on file  . Number of children: Not on file  . Years of education: Not on file  . Highest education level: Not on file  Occupational History  . Not on file  Social Needs  . Financial resource strain: Not on file  . Food insecurity    Worry: Not on file    Inability: Not on file  . Transportation needs    Medical: Not on file    Non-medical: Not on file  Tobacco Use  . Smoking status: Former Smoker    Quit date: 2010    Years since quitting: 10.7  . Smokeless tobacco: Never Used  Substance and Sexual Activity  . Alcohol use: Yes  . Drug use: No  . Sexual activity: Not on file  Lifestyle  . Physical activity    Days per week: 3 days    Minutes per session: Not on file  . Stress: Not on file  Relationships  . Social Herbalist on phone: Not on file    Gets together: Not  on file    Attends religious service: Not on file    Active member of club or organization: Not on file    Attends meetings of clubs or organizations: Not on file    Relationship status: Not on file  Other Topics Concern  . Not on file  Social History Narrative  . Not on file        Objective:  Physical Exam: BP 138/76   Pulse 71   Temp 98.2 F (36.8 C)   Ht 5' 11.5" (1.816 m)   Wt 198 lb 3.2 oz (89.9 kg)   SpO2 98%   BMI 27.26 kg/m  Body mass index is 27.26 kg/m. Wt Readings from Last 3 Encounters:  07/30/19 198 lb 3.2 oz (89.9 kg)  06/29/19 200 lb (90.7 kg)  03/29/17 200 lb (90.7 kg)   Gen: NAD, resting comfortably HEENT: TMs normal bilaterally. OP clear. No thyromegaly noted.  CV: RRR with no murmurs appreciated Pulm: NWOB, CTAB with no crackles, wheezes, or rhonchi GI: Normal bowel sounds present. Soft, Nontender, Nondistended. MSK: no edema, cyanosis, or clubbing noted Skin: warm, dry Neuro: CN2-12 grossly intact. Strength 5/5 in upper and lower extremities. Reflexes symmetric and intact bilaterally.  Psych: Normal affect and thought content     Sharyn Brilliant M. [name], MD 07/30/2019 11:01 AM

## 2019-07-30 NOTE — Progress Notes (Signed)
Please inform patient of the following:  Sodium and thyroid levels are STABLE. We do not need to do any further testing at this time. We can recheck with his next set of labs.  Jesus [name]. [name], MD 07/30/2019 4:09 PM

## 2019-07-30 NOTE — Assessment & Plan Note (Signed)
At goal.  Continue HCTZ 25 mg daily and losartan 50 mg daily.

## 2019-07-30 NOTE — Assessment & Plan Note (Signed)
Recently had CT scan done for ongoing cough.  Was found to have left lobe mass.  Unsure if this represents scar tissue or new malignancy.  He has referral to pulmonology pending.  Currently does not have any symptoms.  His cough is resolved.

## 2019-08-01 ENCOUNTER — Other Ambulatory Visit: Payer: Self-pay | Admitting: Family Medicine

## 2019-08-01 NOTE — Telephone Encounter (Signed)
Rx request 

## 2019-08-01 NOTE — Telephone Encounter (Signed)
Rx Request 

## 2019-08-01 NOTE — Telephone Encounter (Signed)
See below

## 2019-08-01 NOTE — Telephone Encounter (Signed)
Requested medication (s) are due for refill today: yes  Requested medication (s) are on the active medication list: yes  Last refill:  06/11/2019  Future visit scheduled: no  Notes to clinic:  Review for refill   Requested Prescriptions  Pending Prescriptions Disp Refills   ALPRAZolam (XANAX) 0.5 MG tablet       Sig: Take 1 tablet (0.5 mg total) by mouth 2 (two) times daily as needed.     Not Delegated - Psychiatry:  Anxiolytics/Hypnotics Failed - 08/01/2019 11:19 AM      Failed - This refill cannot be delegated      Failed - Urine Drug Screen completed in last 360 days.      Passed - Valid encounter within last 6 months    Recent Outpatient Visits          2 days ago Need for influenza vaccination   [facility], Algis Greenhouse, MD   3 weeks ago Cough   [facility] Bread, Algis Greenhouse, MD   1 month ago Essential hypertension   [place] [facility], Algis Greenhouse, MD              atorvastatin (LIPITOR) 20 MG tablet       Sig: Take 1 tablet (20 mg total) by mouth daily.     Cardiovascular:  Antilipid - Statins Passed - 08/01/2019 11:19 AM      Passed - Total Cholesterol in normal range and within 360 days    Cholesterol  Date Value Ref Range Status  06/29/2019 109 0 - 200 mg/dL Final    Comment:    ATP III Classification       Desirable:  < 200 mg/dL               Borderline High:  200 - 239 mg/dL          High:  > = 240 mg/dL         Passed - LDL in normal range and within 360 days    LDL Cholesterol  Date Value Ref Range Status  06/29/2019 49 0 - 99 mg/dL Final         Passed - HDL in normal range and within 360 days    HDL  Date Value Ref Range Status  06/29/2019 46.70 >39.00 mg/dL Final         Passed - Triglycerides in normal range and within 360 days    Triglycerides  Date Value Ref Range Status  06/29/2019 67.0 0.0 - 149.0 mg/dL Final    Comment:    Normal:  <150 mg/dLBorderline High:  150  - 199 mg/dL         Passed - Patient is not pregnant      Passed - Valid encounter within last 12 months    Recent Outpatient Visits          2 days ago Need for influenza vaccination   [facility], Algis Greenhouse, MD   3 weeks ago Cough   [facility] Bread, Algis Greenhouse, MD   1 month ago Essential hypertension   [facility] [address] Algis Greenhouse, MD

## 2019-08-01 NOTE — Telephone Encounter (Signed)
ALPRAZolam (XANAX) 0.5 MG tablet  atorvastatin (LIPITOR) 20 MG tablet  [facility]

## 2019-08-02 MED ORDER — ALPRAZOLAM 0.5 MG PO TABS: 0.5000 mg | ORAL_TABLET | Freq: Two times a day (BID) | ORAL | 5 refills | Status: DC | PRN

## 2019-08-02 MED ORDER — ATORVASTATIN CALCIUM 20 MG PO TABS: 20.0000 mg | ORAL_TABLET | Freq: Every day | ORAL | 3 refills | Status: AC

## 2019-08-23 ENCOUNTER — Telehealth: Payer: Self-pay

## 2019-08-23 ENCOUNTER — Other Ambulatory Visit: Payer: Self-pay

## 2019-08-23 DIAGNOSIS — I10 Essential (primary) hypertension: Secondary | ICD-10-CM

## 2019-08-23 NOTE — Telephone Encounter (Signed)
Ok with me. Please place any necessary orders. 

## 2019-08-23 NOTE — Telephone Encounter (Signed)
Referral has been placed. 

## 2019-08-23 NOTE — Telephone Encounter (Signed)
Please advise 

## 2019-08-23 NOTE — Telephone Encounter (Signed)
Copied from [place] [id number]. Topic: Referral - Request for Referral >> Aug 23, 2019 10:06 AM Alanda Slim E wrote: Has patient seen PCP for this complaint? Yes *If NO, is insurance requiring patient see PCP for this issue before PCP can refer them? Referral for which specialty: Cardiologist  Preferred provider/office:  Reason for referral: high BP and Pt has always had a cardiologist (EKG and stress test) / Pt would like to establish a relationship with a cardiologist

## 2019-09-03 ENCOUNTER — Ambulatory Visit: Payer: Medicare Other | Admitting: Pulmonary Disease

## 2019-09-03 ENCOUNTER — Other Ambulatory Visit: Payer: Self-pay

## 2019-09-03 ENCOUNTER — Encounter: Payer: Self-pay | Admitting: Pulmonary Disease

## 2019-09-03 VITALS — BP 132/76 | HR 96 | Ht 71.5 in | Wt 204.2 lb

## 2019-09-03 DIAGNOSIS — R9389 Abnormal findings on diagnostic imaging of other specified body structures: Secondary | ICD-10-CM

## 2019-09-03 DIAGNOSIS — R05 Cough: Secondary | ICD-10-CM | POA: Diagnosis not present

## 2019-09-03 DIAGNOSIS — C3412 Malignant neoplasm of upper lobe, left bronchus or lung: Secondary | ICD-10-CM

## 2019-09-03 DIAGNOSIS — R059 Cough, unspecified: Secondary | ICD-10-CM

## 2019-09-03 NOTE — Progress Notes (Signed)
Subjective:    Patient ID: Jesus [name], male    DOB: [birth year], 73 y.o.   MRN: [id number]  Patient is being seen for abnormal CT scan of the chest  Patient recently relocated from [place] History of colon cancer about 20 years ago for which he had surgery Chemoradiation  10 years ago had a lung nodule on the right side-was removed-related to colon cancer  5 years ago out of nodule on the left side-this was a primary lung cancer He did have surgical removal-no other treatment was offered  Stated he had a's PET scan 3 years ago and he was not advised of any abnormality  Has not had chest x-ray recently  He recently had a chest x-ray in September followed up by a CT scan of the chest showing a left lung mass Chest x-ray was ordered in follow-up for complaints of a cough  He has no symptoms today Denies shortness of breath No night sweats Good appetite  Has not been losing any weight Does have some chest discomfort anteriorly   Past Medical History:  Diagnosis Date  . Arthritis   . Cancer ([place])   . Colon cancer ([place])    2000  . Colon polyp   . GERD (gastroesophageal reflux disease)   . Hyperlipidemia   . Hypertension   . Lung cancer ([place])    2010   Social History   Socioeconomic History  . Marital status: Married    Spouse name: Not on file  . Number of children: Not on file  . Years of education: Not on file  . Highest education level: Not on file  Occupational History  . Not on file  Social Needs  . Financial resource strain: Not on file  . Food insecurity    Worry: Not on file    Inability: Not on file  . Transportation needs    Medical: Not on file    Non-medical: Not on file  Tobacco Use  . Smoking status: Former Smoker    Quit date: 2010    Years since quitting: 10.8  . Smokeless tobacco: Never Used  Substance and Sexual Activity  . Alcohol use: Yes  . Drug use: No  . Sexual activity: Not on file  Lifestyle  . Physical activity   Days per week: 3 days    Minutes per session: Not on file  . Stress: Not on file  Relationships  . Social Herbalist on phone: Not on file    Gets together: Not on file    Attends religious service: Not on file    Active member of club or organization: Not on file    Attends meetings of clubs or organizations: Not on file    Relationship status: Not on file  . Intimate partner violence    Fear of current or ex partner: Not on file    Emotionally abused: Not on file    Physically abused: Not on file    Forced sexual activity: Not on file  Other Topics Concern  . Not on file  Social History Narrative  . Not on file   Family History  Problem Relation Age of Onset  . Heart disease Father   . Breast cancer Sister   . Breast cancer Daughter   . Prostate cancer Neg Hx    Review of Systems  Constitutional: Negative for fever and unexpected weight change.  HENT: Positive for trouble swallowing. Negative for congestion, dental problem, ear pain,  nosebleeds, postnasal drip, rhinorrhea, sinus pressure, sneezing and sore throat.   Eyes: Negative for redness and itching.  Respiratory: Negative for cough, chest tightness, shortness of breath and wheezing.   Cardiovascular: Negative for palpitations and leg swelling.  Gastrointestinal: Negative for nausea and vomiting.  Genitourinary: Negative for dysuria.  Musculoskeletal: Negative for joint swelling.  Skin: Negative for rash.  Allergic/Immunologic: Negative.  Negative for environmental allergies, food allergies and immunocompromised state.  Neurological: Negative for headaches.  Hematological: Does not bruise/bleed easily.  Psychiatric/Behavioral: Negative for dysphoric mood. The patient is nervous/anxious.        Objective:   Physical Exam Constitutional:      Appearance: Normal appearance.  HENT:     Head: Normocephalic and atraumatic.     Nose: Nose normal.     Mouth/Throat:     Pharynx: No oropharyngeal exudate.   Eyes:     Pupils: Pupils are equal, round, and reactive to light.  Neck:     Musculoskeletal: Normal range of motion and neck supple. No neck rigidity.  Cardiovascular:     Rate and Rhythm: Normal rate and regular rhythm.     Pulses: Normal pulses.     Heart sounds: Normal heart sounds. No murmur.  Pulmonary:     Effort: Pulmonary effort is normal. No respiratory distress.     Breath sounds: Normal breath sounds. No stridor. No wheezing or rhonchi.  Abdominal:     General: There is no distension.     Palpations: There is no mass.  Musculoskeletal: Normal range of motion.        General: No swelling or tenderness.  Skin:    General: Skin is warm and dry.     Coloration: Skin is not jaundiced.  Neurological:     General: No focal deficit present.     Mental Status: He is alert and oriented to person, place, and time.  Psychiatric:        Mood and Affect: Mood normal.    Chest x-ray and CT scan of the chest was reviewed with the patient showing a 5 cm pleural-based mass lesion with cavitation, hilar prominence -Film was reviewed with the patient    Assessment & Plan:  .  Abnormal CT scan of the chest showing a 5 cm lung mass with cavitation .  Previous history of lung cancer. . 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  PET scan 3 years ago was not noted to be abnormal  .  Patient has no significant symptoms at present apart from a persistent cough  .  Possibilities include a neoplastic process especially with his previous history of lung cancer and history of smoking .  Infectious process is less likely .  Inflammatory process is less likely  .  Secondary to the extent of the lesion and part of it being pleural-based and part of it been more centrally located Patient will benefit from having a PET scan to ascertain what part of this lesion is most active to guide biopsy  He will require navigational bronchoscopy for biopsy or CT guided biopsy depending  on activity  .  Patient wants to consider his options .  He does not want [place] to order the PET scan at present .  He was encouraged to give me a call to let us know when we can order the PET scan .  I did let him know that the sooner the better we get scans, then we can plan  for biopsy  He is no longer smoking Denies significant shortness of breath  At some point will require pulmonary function studies  The possibility of a neoplastic process is definitely concerning in this situation   Films reviewed with the patient, diagnostic possibilities reviewed with the patient Options of intervention reviewed and discussed  I will see him back in the office in about 6 weeks

## 2019-09-03 NOTE — Patient Instructions (Signed)
Abnormal CT scan of the chest showing a left lung mass  My recommendation will be a PET scan and possible biopsy PET scan will guide [place] with respect to all we can optimally get a biopsy that tells [place] what is going on in your lungs  I will wait to hear from you before the PET scan is scheduled as we discussed It will be helpful if we get information regarding your previous PET scan done in the last 2 to 3 years  I am concerned that this is a process that we need to evaluate and biopsy as soon as possible  Call with any significant concerns  We will make an appointment tentatively for 6 weeks

## 2019-09-05 ENCOUNTER — Other Ambulatory Visit: Payer: Self-pay

## 2019-09-05 DIAGNOSIS — Z20822 Contact with and (suspected) exposure to covid-19: Secondary | ICD-10-CM

## 2019-09-07 LAB — NOVEL CORONAVIRUS, NAA: SARS-CoV-2, NAA: NOT DETECTED

## 2019-09-26 DIAGNOSIS — L814 Other melanin hyperpigmentation: Secondary | ICD-10-CM | POA: Diagnosis not present

## 2019-09-26 DIAGNOSIS — L821 Other seborrheic keratosis: Secondary | ICD-10-CM | POA: Diagnosis not present

## 2019-09-26 DIAGNOSIS — L570 Actinic keratosis: Secondary | ICD-10-CM | POA: Diagnosis not present

## 2019-10-01 NOTE — Progress Notes (Signed)
Cardiology Office Note:    Date:  10/03/2019   ID:  Jesus [name], DOB February 14, 1946, MRN [id number]  PCP:  Vivi Barrack, MD  Cardiologist:  No primary care provider on file.   Referring MD: Vivi Barrack, MD   Chief Complaint  Patient presents with  . Hypertension    History of Present Illness:    Jesus [name] is a 73 y.o. male with a hx of hypertension, referred for evaluation.  The patient states that he gets a cardiac evaluation every once in a while because he is worried about his health.  His father died of a myocardial infarction at 76.  His brother died of a CVA at age 42.  Mother died at age 26 9 cardiovascular related.  He loves playing golf.  He stays active.  He moved from the villages in [place] to [place] because his wife grew up here.  [place]  He walks 2 miles a day.  No exertional dyspnea, orthopnea, PND, chest tightness, or claudication.  He drinks more than 2 alcoholic beverages every day.  He quit smoking 10 years ago.  Past Medical History:  Diagnosis Date  . Arthritis   . Cancer ([place])   . Colon cancer ([place])    2000  . Colon polyp   . GERD (gastroesophageal reflux disease)   . Hyperlipidemia   . Hypertension   . Lung cancer ([place])    2010    Past Surgical History:  Procedure Laterality Date  . ANKLE FUSION     crushed ankle while in high school  . APPENDECTOMY     1960's    Current Medications: Current Meds  Medication Sig  . ALPRAZolam (XANAX) 0.5 MG tablet Take 1 tablet (0.5 mg total) by mouth 2 (two) times daily as needed.  Marland Kitchen Apoaequorin (PREVAGEN PO) Take by mouth.  Marland Kitchen atorvastatin (LIPITOR) 20 MG tablet Take 1 tablet (20 mg total) by mouth daily.  . B Complex Vitamins (B COMPLEX PO) Take by mouth.  . Cyanocobalamin (VITAMIN B-12 PO) Take by mouth.  . hydrochlorothiazide (HYDRODIURIL) 25 MG tablet Take 1 tablet by mouth daily.  Marland Kitchen losartan (COZAAR) 50 MG tablet  Take 1 tablet (50 mg total) by mouth daily.  . Multiple Vitamin (MULTIVITAMIN PO) Take by mouth.  . Multiple Vitamins-Minerals (ZINC PO) Take by mouth.  Marland Kitchen VITAMIN D PO Take 800 Units by mouth.      Allergies:   Patient has no known allergies.   Social History   Socioeconomic History  . Marital status: Married    Spouse name: Not on file  . Number of children: Not on file  . Years of education: Not on file  . Highest education level: Not on file  Occupational History  . Not on file  Social Needs  . Financial resource strain: Not on file  . Food insecurity    Worry: Not on file    Inability: Not on file  . Transportation needs    Medical: Not on file    Non-medical: Not on file  Tobacco Use  . Smoking status: Former Smoker    Quit date: 2010    Years since quitting: 10.9  . Smokeless tobacco: Never Used  Substance and Sexual Activity  . Alcohol use: Yes  . Drug use: No  . Sexual activity: Not on file  Lifestyle  . Physical activity    Days per week: 3  days    Minutes per session: Not on file  . Stress: Not on file  Relationships  . Social Herbalist on phone: Not on file    Gets together: Not on file    Attends religious service: Not on file    Active member of club or organization: Not on file    Attends meetings of clubs or organizations: Not on file    Relationship status: Not on file  Other Topics Concern  . Not on file  Social History Narrative  . Not on file     Family History: The patient's family history includes Breast cancer in his daughter and sister; Heart disease in his father; Stroke in his brother. There is no history of Prostate cancer.  ROS:   Please see the history of present illness.    He has had partial lung resection on both lungs, 1 4 for metastasis from his colon the other because of primary lung cancer in 2008 and 2010 respectively.  He has had resection of colon cancer 20 years ago.  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  All other systems reviewed and are negative.  EKGs/Labs/Other Studies Reviewed:    The following studies were reviewed today: No functional data  EKG:  EKG EKG unremarkable demonstrating normal sinus rhythm with overall normal appearance.  Recent Labs: 06/29/2019: ALT 20; Hemoglobin 14.2; Platelets 288.0 07/30/2019: BUN 9; Creatinine, Ser 0.69; Potassium 4.9; Sodium 132; TSH 0.81  Recent Lipid Panel    Component Value Date/Time   CHOL 109 06/29/2019 1112   TRIG 67.0 06/29/2019 1112   HDL 46.70 06/29/2019 1112   CHOLHDL 2 06/29/2019 1112   VLDL 13.4 06/29/2019 1112   LDLCALC 49 06/29/2019 1112    Physical Exam:    VS:  BP 124/76   Pulse 71   Ht 5' 11.5" (1.816 m)   Wt 204 lb 6.4 oz (92.7 kg)   SpO2 96%   BMI 28.11 kg/m     Wt Readings from Last 3 Encounters:  10/03/19 204 lb 6.4 oz (92.7 kg)  09/03/19 204 lb 3.2 oz (92.6 kg)  07/30/19 198 lb 3.2 oz (89.9 kg)     GEN: Consistent with age.  No acute distress HEENT: Normal NECK: No JVD. LYMPHATICS: No lymphadenopathy CARDIAC:  RRR without murmur, gallop, or edema. VASCULAR:  Normal Pulses. No bruits. RESPIRATORY:  Clear to auscultation without rales, wheezing or rhonchi  ABDOMEN: Soft, non-tender, non-distended, No pulsatile mass, MUSCULOSKELETAL: No deformity  SKIN: Warm and dry NEUROLOGIC:  Alert and oriented x 3 PSYCHIATRIC:  Normal affect   ASSESSMENT:    1. Essential hypertension   2. Dyslipidemia   3. 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 Family history of cardiovascular disease   5. Excessive consumption of ethanol   6. Educated about COVID-19 virus infection    PLAN:    In order of problems listed above:  1. Excellent blood pressure on current medical regimen. 2. Lipids done earlier this year were excellent with LDL of 49 in September. 3. Status post resection of malignancy from both lungs, most recent operation 10 years ago. 4. Cardiovascular  risk factors and concern about development of vascular disease is the reason that the patient is here.  We had a long discussion concerning primary prevention.  I have encouraged him to stop drinking alcohol on a daily basis. 5. I encouraged that he DC daily alcohol intake.  He did not endorse the idea. 6. 3W's is being practiced to avoid COVID-19   As needed follow-up.  Currently stable.  No testing felt indicated.   Overall education and awareness concerning primary risk prevention was discussed in detail: LDL less than 70, hemoglobin A1c less than 7, blood pressure target less than 130/80 mmHg, >150 minutes of moderate aerobic activity per week, avoidance of smoking, weight control (via diet and exercise), and continued surveillance/management of/for obstructive sleep apnea.    Medication Adjustments/Labs and Tests Ordered: Current medicines are reviewed at length with the patient today.  Concerns regarding medicines are outlined above.  No orders of the defined types were placed in this encounter.  No orders of the defined types were placed in this encounter.   Patient Instructions  Medication Instructions:  Your physician recommends that you continue on your current medications as directed. Please refer to the Current Medication list given to you today.  *If you need a refill on your cardiac medications before your next appointment, please call your pharmacy*  Lab Work: None If you have labs (blood work) drawn today and your tests are completely normal, you will receive your results only by: Marland Kitchen MyChart Message (if you have MyChart) OR . A paper copy in the mail If you have any lab test that is abnormal or we need to change your treatment, we will call you to review the results.  Testing/Procedures: None  Follow-Up: At [facility], you and your health needs are our priority.  As part of our continuing mission to provide you with exceptional heart care, we have created  designated Provider Care Teams.  These Care Teams include your primary Cardiologist (physician) and Advanced Practice Providers (APPs -  Physician Assistants and Nurse Practitioners) who all work together to provide you with the care you need, when you need it.  Your next appointment:   As needed   The format for your next appointment:   In Person  Provider:   You may see Dr. Daneen Schick or one of the following Advanced Practice Providers on your designated Care Team:    Truitt Merle, NP  Cecilie Kicks, NP  Kathyrn Drown, NP   Other Instructions      Signed, Sinclair Grooms, MD  10/03/2019 3:00 PM    [place]

## 2019-10-03 ENCOUNTER — Other Ambulatory Visit: Payer: Self-pay

## 2019-10-03 ENCOUNTER — Encounter: Payer: Self-pay | Admitting: Interventional Cardiology

## 2019-10-03 ENCOUNTER — Ambulatory Visit: Payer: Medicare Other | Admitting: Interventional Cardiology

## 2019-10-03 VITALS — BP 124/76 | HR 71 | Ht 71.5 in | Wt 204.4 lb

## 2019-10-03 DIAGNOSIS — I10 Essential (primary) hypertension: Secondary | ICD-10-CM

## 2019-10-03 DIAGNOSIS — E785 Hyperlipidemia, unspecified: Secondary | ICD-10-CM

## 2019-10-03 DIAGNOSIS — Z7289 Other problems related to lifestyle: Secondary | ICD-10-CM | POA: Diagnosis not present

## 2019-10-03 DIAGNOSIS — Z8249 Family history of ischemic heart disease and other diseases of the circulatory system: Secondary | ICD-10-CM

## 2019-10-03 DIAGNOSIS — F1010 Alcohol abuse, uncomplicated: Secondary | ICD-10-CM

## 2019-10-03 DIAGNOSIS — Z7189 Other specified counseling: Secondary | ICD-10-CM

## 2019-10-03 DIAGNOSIS — C3412 Malignant neoplasm of upper lobe, left bronchus or lung: Secondary | ICD-10-CM | POA: Diagnosis not present

## 2019-10-03 NOTE — Addendum Note (Signed)
Addended by: Carylon Perches on: 10/03/2019 04:50 PM   Modules accepted: Orders

## 2019-10-03 NOTE — Patient Instructions (Signed)
Medication Instructions:  Your physician recommends that you continue on your current medications as directed. Please refer to the Current Medication list given to you today.  *If you need a refill on your cardiac medications before your next appointment, please call your pharmacy*  Lab Work: None If you have labs (blood work) drawn today and your tests are completely normal, you will receive your results only by: Marland Kitchen MyChart Message (if you have MyChart) OR . A paper copy in the mail If you have any lab test that is abnormal or we need to change your treatment, we will call you to review the results.  Testing/Procedures: None  Follow-Up: At [facility], you and your health needs are our priority.  As part of our continuing mission to provide you with exceptional heart care, we have created designated Provider Care Teams.  These Care Teams include your primary Cardiologist (physician) and Advanced Practice Providers (APPs -  Physician Assistants and Nurse Practitioners) who all work together to provide you with the care you need, when you need it.  Your next appointment:   As needed   The format for your next appointment:   In Person  Provider:   You may see Dr. Daneen Schick or one of the following Advanced Practice Providers on your designated Care Team:    Truitt Merle, NP  Cecilie Kicks, NP  Kathyrn Drown, NP   Other Instructions

## 2019-10-10 ENCOUNTER — Other Ambulatory Visit: Payer: Self-pay

## 2019-10-10 ENCOUNTER — Ambulatory Visit: Payer: Medicare Other | Admitting: Podiatry

## 2019-10-10 ENCOUNTER — Encounter: Payer: Self-pay | Admitting: Podiatry

## 2019-10-10 VITALS — BP 115/64

## 2019-10-10 DIAGNOSIS — M79675 Pain in left toe(s): Secondary | ICD-10-CM | POA: Diagnosis not present

## 2019-10-10 DIAGNOSIS — B351 Tinea unguium: Secondary | ICD-10-CM | POA: Diagnosis not present

## 2019-10-10 DIAGNOSIS — G629 Polyneuropathy, unspecified: Secondary | ICD-10-CM | POA: Diagnosis not present

## 2019-10-10 DIAGNOSIS — M79674 Pain in right toe(s): Secondary | ICD-10-CM

## 2019-10-10 DIAGNOSIS — L853 Xerosis cutis: Secondary | ICD-10-CM

## 2019-10-10 NOTE — Progress Notes (Signed)
  Subjective:  Patient ID: Jesus [name], male    DOB: 1946/02/23,  MRN: [id number]  Chief Complaint  Patient presents with  . Foot Pain    pt is here for a foot evaluation, pt states that he is looking to get established   73 y.o. male returns for the above complaint.  Patient presents with forefoot evaluation.  Patient states that he has recently moved here from [place] and would like to have an established podiatrist here to evaluate his PCP.  He states that he does have bilateral numbness from unknown origin.  He states that he has been applying ciclopirox cream to the bottom of his foot for dryness.  It was felt being followed by a podiatrist and [place].  He would like to move his care over to me for now.  He denies any other acute complaints.  He denies any nausea fever chills vomiting.  He denies any pain to the foot of his foot.  Objective:   Vitals:   10/10/19 0959  BP: 115/64   Podiatric Exam: Vascular: dorsalis pedis and posterior tibial pulses are palpable bilateral. Capillary return is immediate. Temperature gradient is WNL. Skin turgor WNL  Sensorium: Absent Semmes Weinstein monofilament test.  Absent tactile sensation bilaterally Nail Exam: Pt has thick disfigured discolored nails with subungual debris noted bilateral entire nail hallux through fifth toenails Ulcer Exam: There is no evidence of ulcer or pre-ulcerative changes or infection. Orthopedic Exam: Muscle tone and strength are WNL. No limitations in general ROM. No crepitus or effusions noted. HAV  B/L.  Hammer toes 2-5  B/L. Skin: No Porokeratosis. No infection or ulcers  Assessment & Plan:  Patient was evaluated and treated and all questions answered.  Onychomycosis with pain  -Nails palliatively debrided as below. -Educated on self-care  Xerosis plantar foot bilaterally -Educated the patient etiology of xerosis in relationship with neuropathy and various treatment options associated with it.  Patient  would like to continue using his ciclopirox cream as he states that it has been helping him.  Procedure: Nail Debridement Rationale: pain  Type of Debridement: manual, sharp debridement. Instrumentation: Nail nipper, rotary burr. Number of Nails: 10  Procedures and Treatment: Consent by patient was obtained for treatment procedures. The patient understood the discussion of treatment and procedures well. All questions were answered thoroughly reviewed. Debridement of mycotic and hypertrophic toenails, 1 through 5 bilateral and clearing of subungual debris. No ulceration, no infection noted.  Return Visit-Office Procedure: Patient instructed to return to the office for a follow up visit 3 months for continued evaluation and treatment.  Boneta Lucks, DPM    No follow-ups on file.

## 2019-10-11 ENCOUNTER — Telehealth: Payer: Self-pay | Admitting: Pulmonary Disease

## 2019-10-15 ENCOUNTER — Other Ambulatory Visit: Payer: Self-pay

## 2019-10-15 ENCOUNTER — Encounter: Payer: Self-pay | Admitting: Pulmonary Disease

## 2019-10-15 ENCOUNTER — Ambulatory Visit: Payer: Medicare Other | Admitting: Pulmonary Disease

## 2019-10-15 VITALS — BP 122/74 | HR 83 | Ht 71.0 in | Wt 200.0 lb

## 2019-10-15 DIAGNOSIS — C3412 Malignant neoplasm of upper lobe, left bronchus or lung: Secondary | ICD-10-CM | POA: Diagnosis not present

## 2019-10-15 NOTE — Progress Notes (Signed)
Subjective:    Patient ID: Jesus [name], male    DOB: 1946-06-09, 73 y.o.   MRN: [id number]  Patient is being seen for abnormal CT scan of the chest  Patient recently relocated from [place] History of colon cancer about 20 years ago for which he had surgery Chemoradiation  10 years ago had a lung nodule on the right side-was removed-related to colon cancer  5 years ago out of nodule on the left side-this was a primary lung cancer He did have surgical removal-no other treatment was offered  Stated he had a's PET scan 3 years ago and he was not advised of any abnormality  He recently had a chest x-ray in September followed up by a CT scan of the chest showing a left lung mass Chest x-ray was ordered in follow-up for complaints of a cough  He has no symptoms today Denies shortness of breath No night sweats Good appetite  Has not been losing any weight Does have some chest discomfort anteriorly He continues to feel well In for follow-up today Still waiting on information from previous CT   Past Medical History:  Diagnosis Date  . Arthritis   . Cancer ([place])   . Colon cancer ([place])    2000  . Colon polyp   . GERD (gastroesophageal reflux disease)   . Hyperlipidemia   . Hypertension   . Lung cancer ([place])    2010   Social History   Socioeconomic History  . Marital status: Married    Spouse name: Not on file  . Number of children: Not on file  . Years of education: Not on file  . Highest education level: Not on file  Occupational History  . Not on file  Tobacco Use  . Smoking status: Former Smoker    Quit date: 2010    Years since quitting: 10.9  . Smokeless tobacco: Never Used  Substance and Sexual Activity  . Alcohol use: Yes  . Drug use: No  . Sexual activity: Not on file  Other Topics Concern  . Not on file  Social History Narrative  . Not on file   Social Determinants of Health   Financial Resource Strain:   . Difficulty of Paying Living  Expenses: Not on file  Food Insecurity:   . Worried About Charity fundraiser in the Last Year: Not on file  . Ran Out of Food in the Last Year: Not on file  Transportation Needs:   . Lack of Transportation (Medical): Not on file  . Lack of Transportation (Non-Medical): Not on file  Physical Activity: Unknown  . Days of Exercise per Week: 3 days  . Minutes of Exercise per Session: Not on file  Stress:   . Feeling of Stress : Not on file  Social Connections:   . Frequency of Communication with Friends and Family: Not on file  . Frequency of Social Gatherings with Friends and Family: Not on file  . Attends Religious Services: Not on file  . Active Member of Clubs or Organizations: Not on file  . Attends Archivist Meetings: Not on file  . Marital Status: Not on file  Intimate Partner Violence:   . Fear of Current or Ex-Partner: Not on file  . Emotionally Abused: Not on file  . Physically Abused: Not on file  . Sexually Abused: Not on file   Family History  Problem Relation Age of Onset  . Heart disease Father   . Stroke Brother   .  Breast cancer Sister   . Breast cancer Daughter   . Prostate cancer Neg Hx    Review of Systems  Constitutional: Negative for fever and unexpected weight change.  HENT: Positive for trouble swallowing. Negative for congestion, dental problem, ear pain, nosebleeds, postnasal drip, rhinorrhea, sinus pressure, sneezing and sore throat.   Eyes: Negative for redness and itching.  Respiratory: Negative for cough, chest tightness, shortness of breath and wheezing.   Cardiovascular: Negative for palpitations and leg swelling.  Gastrointestinal: Negative for nausea and vomiting.  Genitourinary: Negative for dysuria.  Musculoskeletal: Negative for joint swelling.  Skin: Negative for rash.  Allergic/Immunologic: Negative.  Negative for environmental allergies, food allergies and immunocompromised state.  Neurological: Negative for headaches.    Hematological: Does not bruise/bleed easily.  Psychiatric/Behavioral: Negative for dysphoric mood. The patient is nervous/anxious.        Objective:   Physical Exam Constitutional:      Appearance: Normal appearance.  HENT:     Head: Normocephalic and atraumatic.     Nose: Nose normal.     Mouth/Throat:     Pharynx: No oropharyngeal exudate.  Eyes:     Pupils: Pupils are equal, round, and reactive to light.  Cardiovascular:     Rate and Rhythm: Normal rate and regular rhythm.     Pulses: Normal pulses.     Heart sounds: Normal heart sounds. No murmur.  Pulmonary:     Effort: Pulmonary effort is normal. No respiratory distress.     Breath sounds: No stridor. No wheezing or rhonchi.     Comments: Decreased breath sounds at the bases Musculoskeletal:     Cervical back: Normal range of motion and neck supple. No rigidity.  Neurological:     Mental Status: He is alert.    Chest x-ray and CT scan of the chest was reviewed with the patient showing a 5 cm pleural-based mass lesion with cavitation, hilar prominence -Film was reviewed with the patient -Film was reviewed again with patient today    Assessment & Plan:  .  Abnormal CT scan of the chest showing a 5 cm lung mass with cavitation .  Previous history of lung cancer. . 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  PET scan 3 years ago was not noted to be abnormal -Waiting for information from [place] -Following reviewing the CT -It has been about 2 months and 2 weeks since his last CT -We will need a repeat follow-up on the findings -Patient wants to wait until we get a repeat CT before making a decision regarding intervention  .  Patient has no significant symptoms at present apart from a persistent cough  .  Possibilities include a neoplastic process especially with his previous history of lung cancer and history of smoking .  Infectious process is less likely .  Inflammatory process  is less likely  .  Secondary to the extent of the lesion and part of it being pleural-based and part of it been more centrally located Patient will benefit from having a PET scan to ascertain what part of this lesion is most active to guide biopsy  He will require navigational bronchoscopy for biopsy or CT guided biopsy depending on activity  .  Patient wants to consider his options .  He does not want [place] to order the PET scan at present  .  He does not want to order a PET scan today .  Continues to feel well  He is no longer smoking Denies significant shortness of breath  .  We will see him back following repeat CT in 2 weeks .  Call was made today to [facility] to try and get copies of previous CT

## 2019-10-15 NOTE — Patient Instructions (Signed)
We are making another attempt to get a copy of the old study  We will repeat CT scan in 2 weeks  I will bring her back to the office following the CT to review the CT and compare with the one performed 2 months ago  Then we can make plans/arrangements of what to do next  We will see you back in 3 to 4 weeks

## 2019-10-16 ENCOUNTER — Telehealth: Payer: Self-pay | Admitting: Pulmonary Disease

## 2019-10-16 NOTE — Telephone Encounter (Signed)
Patient has CT chest scheduled 11/27/2019.

## 2019-10-16 NOTE — Telephone Encounter (Signed)
Called and left message for Patient to call back. We received fax from [facility] chest from  02/27/19. Dr. Ander Slade would like Patient to know that CT chest 02/27/19, did not show the haziness on the left side as the CT chest that Dr. Ander Slade showed Patient from 07/27/19. Patient is ordered follow up CT chest in 2 weeks per Dr. Ander Slade.

## 2019-10-17 NOTE — Telephone Encounter (Signed)
Called and spoke with Patient.  Dr.Olalere's message to Patient about received fax and last CT comparison given.  Patient has upcoming CT chest scheduled.  Nothing further at this time.

## 2019-10-17 NOTE — Telephone Encounter (Signed)
Pt is returnign call from yesterday from [facility] regarding CXR and CT - CB# [phone]

## 2019-10-24 ENCOUNTER — Other Ambulatory Visit: Payer: Self-pay

## 2019-10-24 ENCOUNTER — Ambulatory Visit (INDEPENDENT_AMBULATORY_CARE_PROVIDER_SITE_OTHER)
Admission: RE | Admit: 2019-10-24 | Discharge: 2019-10-24 | Disposition: A | Discharge status: Home / Self Care | Payer: Medicare Other | Source: Ambulatory Visit | Attending: Pulmonary Disease | Admitting: Pulmonary Disease

## 2019-10-24 DIAGNOSIS — C3412 Malignant neoplasm of upper lobe, left bronchus or lung: Secondary | ICD-10-CM

## 2019-10-24 DIAGNOSIS — R918 Other nonspecific abnormal finding of lung field: Secondary | ICD-10-CM | POA: Diagnosis not present

## 2019-10-30 ENCOUNTER — Inpatient Hospital Stay: Admission: RE | Admit: 2019-10-30 | Payer: Medicare Other | Source: Ambulatory Visit

## 2019-11-05 ENCOUNTER — Telehealth: Payer: Self-pay | Admitting: Pulmonary Disease

## 2019-11-05 DIAGNOSIS — R9389 Abnormal findings on diagnostic imaging of other specified body structures: Secondary | ICD-10-CM

## 2019-11-05 DIAGNOSIS — R911 Solitary pulmonary nodule: Secondary | ICD-10-CM

## 2019-11-05 NOTE — Telephone Encounter (Signed)
Update patient on CT findings   Significant improvement in right lung abnormal process, consistent with likely resolving infectious process, a small 5 mm nodule was also noted which is new  Follow up CT in about 4-6 months is appropriate to keep an eye on the new small nodule and follow right lung process to further improvement

## 2019-11-05 NOTE — Telephone Encounter (Signed)
Called and spoke with Patient.  Dr. Judson Roch results and recommendations given. Understanding stated.  CT chest order placed for 4-6 months.  Nothing further at this time.

## 2019-11-13 DIAGNOSIS — S50812A Abrasion of left forearm, initial encounter: Secondary | ICD-10-CM | POA: Diagnosis not present

## 2019-11-13 DIAGNOSIS — S20212A Contusion of left front wall of thorax, initial encounter: Secondary | ICD-10-CM | POA: Diagnosis not present

## 2019-11-19 ENCOUNTER — Encounter: Payer: Self-pay | Admitting: Physician Assistant

## 2019-11-19 ENCOUNTER — Other Ambulatory Visit: Payer: Self-pay

## 2019-11-19 ENCOUNTER — Ambulatory Visit (INDEPENDENT_AMBULATORY_CARE_PROVIDER_SITE_OTHER): Payer: Medicare Other | Admitting: Physician Assistant

## 2019-11-19 ENCOUNTER — Ambulatory Visit (INDEPENDENT_AMBULATORY_CARE_PROVIDER_SITE_OTHER): Payer: Medicare Other

## 2019-11-19 VITALS — BP 130/84 | HR 81 | Temp 97.3°F | Ht 71.0 in | Wt 200.5 lb

## 2019-11-19 DIAGNOSIS — R0781 Pleurodynia: Secondary | ICD-10-CM

## 2019-11-19 DIAGNOSIS — S299XXA Unspecified injury of thorax, initial encounter: Secondary | ICD-10-CM | POA: Diagnosis not present

## 2019-11-19 NOTE — Progress Notes (Signed)
Jesus [name] is a 74 y.o. male here for a new problem.  I acted as a Education administrator for [employer], PA-C Anselmo Pickler, LPN  History of Present Illness:   Chief Complaint  Patient presents with  . Rib Injury    Pt fell 4-5 days ago    HPI   Rib injury Pt fell 4-5 days ago on the way to the bathroom at 2:00 AM, fell on the floor and landed on left side of ribs. [place] and fell on left side of rib area.  Pt c/o left sided rib pain, hurts when coughing and lying down. Denies SOB, difficulty with taking in a deep breath, chest pain.  Past Medical History:  Diagnosis Date  . Arthritis   . Cancer ([place])   . Colon cancer ([place])    2000  . Colon polyp   . GERD (gastroesophageal reflux disease)   . Hyperlipidemia   . Hypertension   . Lung cancer ([place])    2010     Social History   Socioeconomic History  . Marital status: Married    Spouse name: Not on file  . Number of children: Not on file  . Years of education: Not on file  . Highest education level: Not on file  Occupational History  . Not on file  Tobacco Use  . Smoking status: Former Smoker    Quit date: 2010    Years since quitting: 11.0  . Smokeless tobacco: Never Used  Substance and Sexual Activity  . Alcohol use: Yes  . Drug use: No  . Sexual activity: Not on file  Other Topics Concern  . Not on file  Social History Narrative  . Not on file   Social Determinants of Health   Financial Resource Strain:   . Difficulty of Paying Living Expenses: Not on file  Food Insecurity:   . Worried About Charity fundraiser in the Last Year: Not on file  . Ran Out of Food in the Last Year: Not on file  Transportation Needs:   . Lack of Transportation (Medical): Not on file  . Lack of Transportation (Non-Medical): Not on file  Physical Activity: Unknown  . Days of Exercise per Week: 3 days  . Minutes of Exercise per Session: Not on file  Stress:   . Feeling of  Stress : Not on file  Social Connections:   . Frequency of Communication with Friends and Family: Not on file  . Frequency of Social Gatherings with Friends and Family: Not on file  . Attends Religious Services: Not on file  . Active Member of Clubs or Organizations: Not on file  . Attends Archivist Meetings: Not on file  . Marital Status: Not on file  Intimate Partner Violence:   . Fear of Current or Ex-Partner: Not on file  . Emotionally Abused: Not on file  . Physically Abused: Not on file  . Sexually Abused: Not on file    Past Surgical History:  Procedure Laterality Date  . ANKLE FUSION     crushed ankle while in high school  . APPENDECTOMY     1960's    Family History  Problem Relation Age of Onset  . Heart disease Father   . Stroke Brother   . Breast cancer Sister   . Breast cancer Daughter   . Prostate cancer Neg Hx     No Known Allergies  Current Medications:  Current Outpatient Medications:  .  ALPRAZolam (XANAX) 0.5 MG tablet, Take 1 tablet (0.5 mg total) by mouth 2 (two) times daily as needed., Disp: 60 tablet, Rfl: 5 .  Apoaequorin (PREVAGEN PO), Take by mouth., Disp: , Rfl:  .  atorvastatin (LIPITOR) 20 MG tablet, Take 1 tablet (20 mg total) by mouth daily., Disp: 90 tablet, Rfl: 3 .  B Complex Vitamins (B COMPLEX PO), Take by mouth., Disp: , Rfl:  .  Cyanocobalamin (VITAMIN B-12 PO), Take by mouth., Disp: , Rfl:  .  hydrochlorothiazide (HYDRODIURIL) 25 MG tablet, Take 1 tablet by mouth daily., Disp: , Rfl:  .  losartan (COZAAR) 50 MG tablet, Take 1 tablet (50 mg total) by mouth daily., Disp: 90 tablet, Rfl: 1 .  Multiple Vitamin (MULTIVITAMIN PO), Take by mouth., Disp: , Rfl:  .  Multiple Vitamins-Minerals (ZINC PO), Take by mouth., Disp: , Rfl:  .  VITAMIN D PO, Take 800 Units by mouth. , Disp: , Rfl:    Review of Systems:   ROS  Negative unless otherwise specified per HPI.  Vitals:   Vitals:   11/19/19 1301  BP: 130/84  Pulse: 81   Temp: (!) 97.3 F (36.3 C)  TempSrc: Temporal  SpO2: 98%  Weight: 200 lb 8 oz (90.9 kg)  Height: 5\' 11"  (1.803 m)     Body mass index is 27.96 kg/m.  Physical Exam:   Physical Exam Vitals and nursing note reviewed.  Constitutional:      General: He is not in acute distress.    Appearance: He is well-developed. He is not ill-appearing or toxic-appearing.  Cardiovascular:     Rate and Rhythm: Normal rate and regular rhythm.     Pulses: Normal pulses.     Heart sounds: Normal heart sounds, S1 normal and S2 normal.     Comments: No LE edema Pulmonary:     Effort: Pulmonary effort is normal. No accessory muscle usage, prolonged expiration or respiratory distress.     Breath sounds: Normal breath sounds.  Musculoskeletal:     Comments: Small linear area of ecchymosis to L lateral lower rib; TTP; no obvious deformity palpated  Skin:    General: Skin is warm and dry.  Neurological:     Mental Status: He is alert.     GCS: GCS eye subscore is 4. GCS verbal subscore is 5. GCS motor subscore is 6.  Psychiatric:        Speech: Speech normal.        Behavior: Behavior normal. Behavior is cooperative.       Assessment and Plan:   Traves was seen today for rib injury.  Diagnoses and all orders for this visit:  Rib pain on left side No red flags on discussion. Awaiting radiology read. May trial 800 mg ibuprofen TID x 3 days. Briefly discussed use of muscle relaxer but will defer it at this time. Worsening precautions discussed in the interim, especially in regards to sudden on SOB or chest pain. -     DG Ribs Unilateral W/Chest Left; Future  . Reviewed expectations re: course of current medical issues. . Discussed self-management of symptoms. . Outlined signs and symptoms indicating need for more acute intervention. . Patient verbalized understanding and all questions were answered. . See orders for this visit as documented in the electronic medical record. . Patient  received an After-Visit Summary.  CMA or LPN served as scribe during this visit. History, Physical, and Plan performed by medical provider. The  above documentation has been reviewed and is accurate and complete.   Inda Coke, PA-C

## 2019-11-19 NOTE — Patient Instructions (Signed)
It was great to see you!  May take 800 mg ibuprofen every 8 hours for pain.  I will be in touch with your xray results.  Take care,  Inda Coke PA-C   Rib Contusion A rib contusion is a deep bruise on your rib area. Contusions are the result of a blunt trauma that causes bleeding and injury to the tissues under the skin. A rib contusion may involve bruising of the ribs and of the skin and muscles in the area. The skin over the contusion may turn blue, purple, or yellow. Minor injuries will give you a painless contusion. More severe contusions may stay painful and swollen for a few weeks. What are the causes? This condition is usually caused by a blow, trauma, or direct force to an area of the body. This often occurs while playing contact sports. What are the signs or symptoms? Symptoms of this condition include:  Swelling and redness of the injured area.  Discoloration of the injured area.  Tenderness and soreness of the injured area.  Pain with or without movement. How is this diagnosed? This condition may be diagnosed based on:  Your symptoms and medical history.  A physical exam.  Imaging tests--such as an X-ray, CT scan, or MRI--to determine if there were internal injuries or broken bones (fractures). How is this treated? This condition may be treated with:  Rest. This is often the best treatment for a rib contusion.  Icing. This reduces swelling and inflammation.  Deep-breathing exercises. These may be recommended to reduce the risk for lung collapse and pneumonia.  Medicines. Over-the-counter or prescription medicines may be given to control pain.  Injection of a numbing medicine around the nerve near your injury (nerve block). Follow these instructions at home:     Medicines  Take over-the-counter and prescription medicines only as told by your health care provider.  Do not drive or use heavy machinery while taking prescription pain medicine.  If you  are taking prescription pain medicine, take actions to prevent or treat constipation. Your health care provider may recommend that you: ? Drink enough fluid to keep your urine pale yellow. ? Eat foods that are high in fiber, such as fresh fruits and vegetables, whole grains, and beans. ? Limit foods that are high in fat and processed sugars, such as fried or sweet foods. ? Take an over-the-counter or prescription medicine for constipation. Managing pain, stiffness, and swelling  If directed, put ice on the injured area: ? Put ice in a plastic bag. ? Place a towel between your skin and the bag. ? Leave the ice on for 20 minutes, 2-3 times a day.  Rest the injured area. Avoid strenuous activity and any activities or movements that cause pain. Be careful during activities and avoid bumping the injured area.  Do not lift anything that is heavier than 5 lb (2.3 kg), or the limit that you are told, until your health care provider says that it is safe. General instructions  Do not use any products that contain nicotine or tobacco, such as cigarettes and e-cigarettes. These can delay healing. If you need help quitting, ask your health care provider.  Do deep-breathing exercises as told by your health care provider.  If you were given an incentive spirometer, use it every 1-2 hours while you are awake, or as recommended by your health care provider. This device measures how well you are filling your lungs with each breath.  Keep all follow-up visits as told by  your health care provider. This is important. Contact a health care provider if you have:  Increased bruising or swelling.  Pain that is not controlled with treatment.  A fever. Get help right away if you:  Have difficulty breathing or shortness of breath.  Develop a continual cough or you cough up thick or bloody sputum.  Feel nauseous or you vomit.  Have pain in your abdomen. Summary  A rib contusion is a deep bruise on your  rib area. Contusions are the result of a blunt trauma that causes bleeding and injury to the tissues under the skin.  The skin overlying the contusion may turn blue, purple, or yellow. Minor injuries may give you a painless contusion. More severe contusions may stay painful and swollen for a few weeks.  Rest the injured area. Avoid strenuous activity and any activities or movements that cause pain. This information is not intended to replace advice given to you by your health care provider. Make sure you discuss any questions you have with your health care provider. Document Revised: 11/09/2017 Document Reviewed: 11/09/2017 Elsevier Patient Education  2020 [employer].

## 2019-11-22 ENCOUNTER — Ambulatory Visit: Payer: Medicare Other

## 2019-11-27 ENCOUNTER — Inpatient Hospital Stay: Admission: RE | Admit: 2019-11-27 | Payer: Medicare Other | Source: Ambulatory Visit

## 2019-11-30 ENCOUNTER — Ambulatory Visit: Payer: Medicare Other | Attending: Internal Medicine

## 2019-11-30 DIAGNOSIS — Z23 Encounter for immunization: Secondary | ICD-10-CM | POA: Insufficient documentation

## 2019-11-30 NOTE — Progress Notes (Signed)
   Covid-19 Vaccination Clinic  Name:  Jesus [name]    MRN: [id number] DOB: 05/05/1946  11/30/2019  [name] was observed post Covid-19 immunization for 15 minutes without incidence. He was provided with Vaccine Information Sheet and instruction to access the V-Safe system.   [name] was instructed to call 911 with any severe reactions post vaccine: Marland Kitchen Difficulty breathing  . Swelling of your face and throat  . A fast heartbeat  . A bad rash all over your body  . Dizziness and weakness    Immunizations Administered    Name Date Dose VIS Date Route   Pfizer COVID-19 Vaccine 11/30/2019  4:02 PM 0.3 mL 10/05/2019 Intramuscular   Manufacturer: [place]   Lot: [id number]   [place]: [id number]

## 2019-12-17 ENCOUNTER — Other Ambulatory Visit: Payer: Self-pay

## 2019-12-17 ENCOUNTER — Telehealth: Payer: Self-pay | Admitting: Family Medicine

## 2019-12-17 MED ORDER — OMEPRAZOLE 20 MG PO CPDR: 20.0000 mg | DELAYED_RELEASE_CAPSULE | Freq: Every day | ORAL | 0 refills | Status: DC

## 2019-12-17 NOTE — Telephone Encounter (Signed)
Rx sent 

## 2019-12-17 NOTE — Telephone Encounter (Signed)
°  LAST APPOINTMENT DATE: 11/19/2019   NEXT APPOINTMENT DATE:@Visit  date not found  MEDICATION:omeprazole 20mg   PHARMACY: [id number] - [place], [address] AT [address] Phone:  [phone]  Fax:  [phone]      Patient said he was prescribed this from his old doctor and he is completely out.    **Let patient know to contact pharmacy at the end of the day to make sure medication is ready. **  ** Please notify patient to allow 48-72 hours to process**  **Encourage patient to contact the pharmacy for refills or they can request refills through [facility]**  CLINICAL FILLS OUT ALL BELOW:   LAST REFILL:  QTY:  REFILL DATE:    OTHER COMMENTS:    Okay for refill?  Please advise

## 2019-12-21 ENCOUNTER — Telehealth: Payer: Self-pay

## 2019-12-21 ENCOUNTER — Other Ambulatory Visit: Payer: Self-pay

## 2019-12-21 MED ORDER — HYDROCHLOROTHIAZIDE 25 MG PO TABS: 25.0000 mg | ORAL_TABLET | Freq: Every day | ORAL | 1 refills | Status: DC

## 2019-12-21 NOTE — Telephone Encounter (Signed)
Rx sent 

## 2019-12-21 NOTE — Telephone Encounter (Signed)
  LAST APPOINTMENT DATE: 12/17/2019   NEXT APPOINTMENT DATE:@Visit  date not found  MEDICATION:hydrochlorothiazide (HYDRODIURIL) 25 MG tablet  PHARMACY: [facility] [place], [place] AT [address] Phone:  [phone]  Fax:  [phone]      Patient states that he is completely out of this medication   **Let patient know to contact pharmacy at the end of the day to make sure medication is ready. **  ** Please notify patient to allow 48-72 hours to process**  **Encourage patient to contact the pharmacy for refills or they can request refills through [facility]**  CLINICAL FILLS OUT ALL BELOW:   LAST REFILL:  QTY:  REFILL DATE:    OTHER COMMENTS:    Okay for refill?  Please advise

## 2019-12-23 ENCOUNTER — Other Ambulatory Visit: Payer: Self-pay | Admitting: Family Medicine

## 2019-12-25 ENCOUNTER — Ambulatory Visit: Payer: Medicare Other | Attending: Internal Medicine

## 2019-12-25 DIAGNOSIS — Z23 Encounter for immunization: Secondary | ICD-10-CM | POA: Insufficient documentation

## 2019-12-25 NOTE — Progress Notes (Signed)
   Covid-19 Vaccination Clinic  Name:  Jesus [name]    MRN: [id number] DOB: December 03, 1945  12/25/2019  [name] was observed post Covid-19 immunization for 15 minutes without incident. He was provided with Vaccine Information Sheet and instruction to access the V-Safe system.   [name] was instructed to call 911 with any severe reactions post vaccine: Marland Kitchen Difficulty breathing  . Swelling of face and throat  . A fast heartbeat  . A bad rash all over body  . Dizziness and weakness   Immunizations Administered    Name Date Dose VIS Date Route   Pfizer COVID-19 Vaccine 12/25/2019  1:06 PM 0.3 mL 10/05/2019 Intramuscular   Manufacturer: [place]   Lot: [id number]   [place]: [id number]

## 2020-01-04 DIAGNOSIS — R338 Other retention of urine: Secondary | ICD-10-CM | POA: Diagnosis not present

## 2020-01-08 ENCOUNTER — Other Ambulatory Visit: Payer: Self-pay | Admitting: Family Medicine

## 2020-01-08 DIAGNOSIS — J00 Acute nasopharyngitis [common cold]: Secondary | ICD-10-CM | POA: Diagnosis not present

## 2020-01-08 DIAGNOSIS — H1089 Other conjunctivitis: Secondary | ICD-10-CM | POA: Diagnosis not present

## 2020-01-08 DIAGNOSIS — R350 Frequency of micturition: Secondary | ICD-10-CM | POA: Diagnosis not present

## 2020-01-08 DIAGNOSIS — R339 Retention of urine, unspecified: Secondary | ICD-10-CM | POA: Diagnosis not present

## 2020-01-08 DIAGNOSIS — H00014 Hordeolum externum left upper eyelid: Secondary | ICD-10-CM | POA: Diagnosis not present

## 2020-01-08 DIAGNOSIS — Z7189 Other specified counseling: Secondary | ICD-10-CM | POA: Diagnosis not present

## 2020-01-09 ENCOUNTER — Ambulatory Visit: Payer: Medicare Other | Admitting: Podiatry

## 2020-01-18 DIAGNOSIS — R338 Other retention of urine: Secondary | ICD-10-CM | POA: Diagnosis not present

## 2020-01-24 ENCOUNTER — Other Ambulatory Visit: Payer: Self-pay

## 2020-01-24 ENCOUNTER — Ambulatory Visit (INDEPENDENT_AMBULATORY_CARE_PROVIDER_SITE_OTHER): Payer: Medicare Other

## 2020-01-24 DIAGNOSIS — Z0000 Encounter for general adult medical examination without abnormal findings: Secondary | ICD-10-CM | POA: Diagnosis not present

## 2020-01-24 NOTE — Patient Instructions (Addendum)
Jesus [name] , Thank you for taking time to come for your Medicare Wellness Visit. I appreciate your ongoing commitment to your health goals. Please review the following plan we discussed and let me know if I can assist you in the future.   Screening recommendations/referrals: Colorectal Screening: up to date; last colonoscopy 09/01/16 with Dr. Raelene Bott   Vision and Dental Exams: Recommended annual ophthalmology exams for early detection of glaucoma and other disorders of the eye Recommended annual dental exams for proper oral hygiene  Vaccinations: Influenza vaccine: completed 07/30/19 Pneumococcal vaccine: up to date  Tdap vaccine: recommended; Please call your insurance company to determine your out of pocket expense. You may  receive this vaccine at your local pharmacy or Health Dept. Shingles vaccine: You may receive this vaccine at your local pharmacy. (see handout)   Advanced directives: Please bring a copy of your POA (Power of Attorney) and/or Living Will to your next appointment.  Goals: Recommend to drink at least 6-8 8oz glasses of water per day and consume a balanced diet rich in fresh fruits and vegetables.   Next appointment: Please schedule your Annual Wellness Visit with your Nurse Health Advisor in one year.  Preventive Care 65 Years and Older, Male Preventive care refers to lifestyle choices and visits with your health care provider that can promote health and wellness. What does preventive care include?  A yearly physical exam. This is also called an annual well check.  Dental exams once or twice a year.  Routine eye exams. Ask your health care provider how often you should have your eyes checked.  Personal lifestyle choices, including:  Daily care of your teeth and gums.  Regular physical activity.  Eating a healthy diet.  Avoiding tobacco and drug use.  Limiting alcohol use.  Practicing safe sex.  Taking low doses of aspirin every day if recommended by  your health care provider..  Taking vitamin and mineral supplements as recommended by your health care provider. What happens during an annual well check? The services and screenings done by your health care provider during your annual well check will depend on your age, overall health, lifestyle risk factors, and family history of disease. Counseling  Your health care provider may ask you questions about your:  Alcohol use.  Tobacco use.  Drug use.  Emotional well-being.  Home and relationship well-being.  Sexual activity.  Eating habits.  History of falls.  Memory and ability to understand (cognition).  Work and work Statistician. Screening  You may have the following tests or measurements:  Height, weight, and BMI.  Blood pressure.  Lipid and cholesterol levels. These may be checked every 5 years, or more frequently if you are over 76 years old.  Skin check.  Lung cancer screening. You may have this screening every year starting at age 77 if you have a 30-pack-year history of smoking and currently smoke or have quit within the past 15 years.  Fecal occult blood test (FOBT) of the stool. You may have this test every year starting at age 6.  Flexible sigmoidoscopy or colonoscopy. You may have a sigmoidoscopy every 5 years or a colonoscopy every 10 years starting at age 69.  Prostate cancer screening. Recommendations will vary depending on your family history and other risks.  Hepatitis C blood test.  Hepatitis B blood test.  Sexually transmitted disease (STD) testing.  Diabetes screening. This is done by checking your blood sugar (glucose) after you have not eaten for a while (fasting). You  may have this done every 1-3 years.  Abdominal aortic aneurysm (AAA) screening. You may need this if you are a current or former smoker.  Osteoporosis. You may be screened starting at age 35 if you are at high risk. Talk with your health care provider about your test  results, treatment options, and if necessary, the need for more tests. Vaccines  Your health care provider may recommend certain vaccines, such as:  Influenza vaccine. This is recommended every year.  Tetanus, diphtheria, and acellular pertussis (Tdap, Td) vaccine. You may need a Td booster every 10 years.  Zoster vaccine. You may need this after age 64.  Pneumococcal 13-valent conjugate (PCV13) vaccine. One dose is recommended after age 56.  Pneumococcal polysaccharide (PPSV23) vaccine. One dose is recommended after age 85. Talk to your health care provider about which screenings and vaccines you need and how often you need them. This information is not intended to replace advice given to you by your health care provider. Make sure you discuss any questions you have with your health care provider. Document Released: 11/07/2015 Document Revised: 06/30/2016 Document Reviewed: 08/12/2015 Elsevier Interactive Patient Education  2017 [place] Prevention in the Home Falls can cause injuries. They can happen to people of all ages. There are many things you can do to make your home safe and to help prevent falls. What can I do on the outside of my home?  Regularly fix the edges of walkways and driveways and fix any cracks.  Remove anything that might make you trip as you walk through a door, such as a raised step or threshold.  Trim any bushes or trees on the path to your home.  Use bright outdoor lighting.  Clear any walking paths of anything that might make someone trip, such as rocks or tools.  Regularly check to see if handrails are loose or broken. Make sure that both sides of any steps have handrails.  Any raised decks and porches should have guardrails on the edges.  Have any leaves, snow, or ice cleared regularly.  Use sand or salt on walking paths during winter.  Clean up any spills in your garage right away. This includes oil or grease spills. What can I do in  the bathroom?  Use night lights.  Install grab bars by the toilet and in the tub and shower. Do not use towel bars as grab bars.  Use non-skid mats or decals in the tub or shower.  If you need to sit down in the shower, use a plastic, non-slip stool.  Keep the floor dry. Clean up any water that spills on the floor as soon as it happens.  Remove soap buildup in the tub or shower regularly.  Attach bath mats securely with double-sided non-slip rug tape.  Do not have throw rugs and other things on the floor that can make you trip. What can I do in the bedroom?  Use night lights.  Make sure that you have a light by your bed that is easy to reach.  Do not use any sheets or blankets that are too big for your bed. They should not hang down onto the floor.  Have a firm chair that has side arms. You can use this for support while you get dressed.  Do not have throw rugs and other things on the floor that can make you trip. What can I do in the kitchen?  Clean up any spills right away.  Avoid walking on  wet floors.  Keep items that you use a lot in easy-to-reach places.  If you need to reach something above you, use a strong step stool that has a grab bar.  Keep electrical cords out of the way.  Do not use floor polish or wax that makes floors slippery. If you must use wax, use non-skid floor wax.  Do not have throw rugs and other things on the floor that can make you trip. What can I do with my stairs?  Do not leave any items on the stairs.  Make sure that there are handrails on both sides of the stairs and use them. Fix handrails that are broken or loose. Make sure that handrails are as long as the stairways.  Check any carpeting to make sure that it is firmly attached to the stairs. Fix any carpet that is loose or worn.  Avoid having throw rugs at the top or bottom of the stairs. If you do have throw rugs, attach them to the floor with carpet tape.  Make sure that you  have a light switch at the top of the stairs and the bottom of the stairs. If you do not have them, ask someone to add them for you. What else can I do to help prevent falls?  Wear shoes that:  Do not have high heels.  Have rubber bottoms.  Are comfortable and fit you well.  Are closed at the toe. Do not wear sandals.  If you use a stepladder:  Make sure that it is fully opened. Do not climb a closed stepladder.  Make sure that both sides of the stepladder are locked into place.  Ask someone to hold it for you, if possible.  Clearly mark and make sure that you can see:  Any grab bars or handrails.  First and last steps.  Where the edge of each step is.  Use tools that help you move around (mobility aids) if they are needed. These include:  Canes.  Walkers.  Scooters.  Crutches.  Turn on the lights when you go into a dark area. Replace any light bulbs as soon as they burn out.  Set up your furniture so you have a clear path. Avoid moving your furniture around.  If any of your floors are uneven, fix them.  If there are any pets around you, be aware of where they are.  Review your medicines with your doctor. Some medicines can make you feel dizzy. This can increase your chance of falling. Ask your doctor what other things that you can do to help prevent falls. This information is not intended to replace advice given to you by your health care provider. Make sure you discuss any questions you have with your health care provider. Document Released: 08/07/2009 Document Revised: 03/18/2016 Document Reviewed: 11/15/2014 Elsevier Interactive Patient Education  2017 [employer].

## 2020-01-24 NOTE — Progress Notes (Signed)
This visit is being conducted via phone call due to the COVID-19 pandemic. This patient has given me verbal consent via phone to conduct this visit, patient states they are participating from their home address. Some vital signs may be absent or patient reported.   Patient identification: identified by name, DOB, and current address.  Location provider: [facility], Office Persons participating in the virtual visit: Denman George LPN, patient, and Dr. Dimas Chyle   Subjective:   Jesus [name] is a 74 y.o. male who presents for an Initial Medicare Annual Wellness Visit.  Review of Systems   Cardiac Risk Factors include: advanced age (>78men, >18 women);male gender;hypertension;dyslipidemia   Objective:    There were no vitals filed for this visit. There is no height or weight on file to calculate BMI.  Advanced Directives 01/24/2020  Does Patient Have a Medical Advance Directive? Yes  Type of Advance Directive Living will;Healthcare Power of Attorney  Does patient want to make changes to medical advance directive? No - Patient declined  Copy of [place] in Chart? No - copy requested    Current Medications (verified) Outpatient Encounter Medications as of 01/24/2020  Medication Sig  . tamsulosin (FLOMAX) 0.4 MG CAPS capsule TAKE 1 CAPSULE BY MOUTH EVERY DAY 1/2 HOUR AFTER SAME MEAL  . ALPRAZolam (XANAX) 0.5 MG tablet Take 1 tablet (0.5 mg total) by mouth 2 (two) times daily as needed.  Marland Kitchen Apoaequorin (PREVAGEN PO) Take by mouth.  Marland Kitchen atorvastatin (LIPITOR) 20 MG tablet Take 1 tablet (20 mg total) by mouth daily.  . B Complex Vitamins (B COMPLEX PO) Take by mouth.  . citalopram (CELEXA) 20 MG tablet Take by mouth.  . Cyanocobalamin (VITAMIN B-12 PO) Take by mouth.  . hydrochlorothiazide (HYDRODIURIL) 25 MG tablet Take 1 tablet (25 mg total) by mouth daily.  Marland Kitchen losartan (COZAAR) 50 MG tablet TAKE 1 TABLET(50 MG) BY MOUTH DAILY  . Multiple Vitamin (MULTIVITAMIN  PO) Take by mouth.  . Multiple Vitamins-Minerals (ZINC PO) Take by mouth.  Marland Kitchen omeprazole (PRILOSEC) 20 MG capsule TAKE 1 CAPSULE(20 MG) BY MOUTH DAILY  . VITAMIN D PO Take 800 Units by mouth.    No facility-administered encounter medications on file as of 01/24/2020.    Allergies (verified) Patient has no known allergies.   History: Past Medical History:  Diagnosis Date  . Arthritis   . Cancer ([place])   . Colon cancer ([place])    2000  . Colon polyp   . GERD (gastroesophageal reflux disease)   . Hyperlipidemia   . Hypertension   . Lung cancer ([place])    2010   Past Surgical History:  Procedure Laterality Date  . ANKLE FUSION     crushed ankle while in high school  . APPENDECTOMY     1960's   Family History  Problem Relation Age of Onset  . Heart disease Father   . Stroke Brother   . Breast cancer Sister   . Breast cancer Daughter   . Prostate cancer Neg Hx    Social History   Socioeconomic History  . Marital status: Married    Spouse name: Not on file  . Number of children: Not on file  . Years of education: Not on file  . Highest education level: Not on file  Occupational History  . Occupation: Retired   Tobacco Use  . Smoking status: Former Smoker    Quit date: 2010    Years since quitting: 11.2  . Smokeless tobacco:  Never Used  Substance and Sexual Activity  . Alcohol use: Yes  . Drug use: No  . Sexual activity: Not on file  Other Topics Concern  . Not on file  Social History Narrative   Lives part of the time in [place] and is followed by the [facility] there   Social Determinants of Health   Financial Resource Strain:   . Difficulty of Paying Living Expenses:   Food Insecurity:   . Worried About Charity fundraiser in the Last Year:   . Arboriculturist in the Last Year:   Transportation Needs:   . Film/video editor (Medical):   Marland Kitchen Lack of Transportation (Non-Medical):   Physical Activity: Unknown  . Days of Exercise per Week: 3  days  . Minutes of Exercise per Session: Not on file  Stress:   . Feeling of Stress :   Social Connections:   . Frequency of Communication with Friends and Family:   . Frequency of Social Gatherings with Friends and Family:   . Attends Religious Services:   . Active Member of Clubs or Organizations:   . Attends Archivist Meetings:   Marland Kitchen Marital Status:    Tobacco Counseling Counseling given: Not Answered   Clinical Intake:  Pre-visit preparation completed: Yes  Pain : No/denies pain  Diabetes: No  How often do you need to have someone help you when you read instructions, pamphlets, or other written materials from your doctor or pharmacy?: 1 - Never  Interpreter Needed?: No  Information entered by :: Denman George LPN  Activities of Daily Living In your present state of health, do you have any difficulty performing the following activities: 01/24/2020  Hearing? N  Vision? N  Difficulty concentrating or making decisions? N  Walking or climbing stairs? N  Dressing or bathing? N  Doing errands, shopping? N  Preparing Food and eating ? N  Using the Toilet? N  In the past six months, have you accidently leaked urine? N  Do you have problems with loss of bowel control? N  Managing your Medications? N  Managing your Finances? N  Housekeeping or managing your Housekeeping? N  Some recent data might be hidden     Immunizations and Health Maintenance Immunization History  Administered Date(s) Administered  . Fluad Quad(high Dose 65+) 07/30/2019  . PFIZER SARS-COV-2 Vaccination 11/30/2019, 12/25/2019  . Zoster 08/05/2013   Health Maintenance Due  Topic Date Due  . Hepatitis C Screening  Never done  . COLONOSCOPY  Never done    Patient Care Team: Vivi Barrack, MD as PCP - General (Family Medicine) Laurin Coder, MD as Consulting Physician (Pulmonary Disease) [place], Jennefer Bravo, MD as Consulting Physician (Dermatology) Belva Crome, MD as  Consulting Physician (Cardiology)  Indicate any recent Medical Services you may have received from other than [facility] providers in the past year (date may be approximate).    Assessment:   This is a routine wellness examination for Jesus [name].  Hearing/Vision screen No exam data present  Dietary issues and exercise activities discussed: Current Exercise Habits: Home exercise routine, Type of exercise: walking;Other - see comments(golfing), Time (Minutes): 45, Frequency (Times/Week): 5, Weekly Exercise (Minutes/Week): 225, Intensity: Mild  Goals   None    Depression Screen PHQ 2/9 Scores 01/24/2020 06/29/2019  PHQ - 2 Score 0 0    Fall Risk Fall Risk  01/24/2020 06/29/2019  Falls in the past year? 1 0  Number falls in past  yr: 1 -  Injury with Fall? 0 -  Risk for fall due to : History of fall(s) -  Follow up Falls evaluation completed;Education provided;Falls prevention discussed -    Is the patient's home free of loose throw rugs in walkways, pet beds, electrical cords, etc?   yes      Grab bars in the bathroom? yes      Handrails on the stairs?   yes      Adequate lighting?   yes  Cognitive Function:     6CIT Screen 01/24/2020  What Year? 0 points  What month? 0 points  What time? 0 points  Count back from 20 0 points  Months in reverse 0 points  Repeat phrase 0 points  Total Score 0    Screening Tests Health Maintenance  Topic Date Due  . Hepatitis C Screening  Never done  . COLONOSCOPY  Never done  . TETANUS/TDAP  06/28/2020 (Originally 11/24/1964)  . PNA vac Low Risk Adult (1 of 2 - PCV13) 06/28/2020 (Originally 11/25/2011)  . INFLUENZA VACCINE  05/25/2020    Qualifies for Shingles Vaccine? Discussed and patient will check with pharmacy for coverage.  Patient education handout provided   Cancer Screenings: Lung: Low Dose CT Chest recommended if Age 44-80 years, 30 pack-year currently smoking OR have quit w/in 15years. Patient does not qualify. Colorectal: colonoscopy  09/01/16 in [place] with Dr. Raelene Bott      Plan:  I have personally reviewed and addressed the Medicare Annual Wellness questionnaire and have noted the following in the patient's chart:  A. Medical and social history B. Use of alcohol, tobacco or illicit drugs  C. Current medications and supplements D. Functional ability and status E.  Nutritional status F.  Physical activity G. Advance directives H. List of other physicians I.  Hospitalizations, surgeries, and ER visits in previous 12 months J.  [place] such as hearing and vision if needed, cognitive and depression L. Referrals, records requested, and appointments- none   In addition, I have reviewed and discussed with patient certain preventive protocols, quality metrics, and best practice recommendations. A written personalized care plan for preventive services as well as general preventive health recommendations were provided to patient.   Signed,  Denman George, LPN  Nurse Health Advisor   Nurse Notes: No additional

## 2020-02-05 DIAGNOSIS — R338 Other retention of urine: Secondary | ICD-10-CM | POA: Diagnosis not present

## 2020-02-08 DIAGNOSIS — J181 Lobar pneumonia, unspecified organism: Secondary | ICD-10-CM | POA: Diagnosis not present

## 2020-02-08 DIAGNOSIS — I951 Orthostatic hypotension: Secondary | ICD-10-CM | POA: Diagnosis not present

## 2020-02-08 DIAGNOSIS — R509 Fever, unspecified: Secondary | ICD-10-CM | POA: Diagnosis not present

## 2020-02-08 DIAGNOSIS — R7402 Elevation of levels of lactic acid dehydrogenase (LDH): Secondary | ICD-10-CM | POA: Diagnosis not present

## 2020-02-08 DIAGNOSIS — J189 Pneumonia, unspecified organism: Secondary | ICD-10-CM | POA: Diagnosis not present

## 2020-02-08 DIAGNOSIS — Z85118 Personal history of other malignant neoplasm of bronchus and lung: Secondary | ICD-10-CM | POA: Diagnosis not present

## 2020-02-08 DIAGNOSIS — R55 Syncope and collapse: Secondary | ICD-10-CM | POA: Diagnosis not present

## 2020-02-08 DIAGNOSIS — Z79899 Other long term (current) drug therapy: Secondary | ICD-10-CM | POA: Diagnosis not present

## 2020-02-08 DIAGNOSIS — R519 Headache, unspecified: Secondary | ICD-10-CM | POA: Diagnosis not present

## 2020-02-08 DIAGNOSIS — Z85038 Personal history of other malignant neoplasm of large intestine: Secondary | ICD-10-CM | POA: Diagnosis not present

## 2020-02-08 DIAGNOSIS — I10 Essential (primary) hypertension: Secondary | ICD-10-CM | POA: Diagnosis not present

## 2020-02-08 DIAGNOSIS — Z87891 Personal history of nicotine dependence: Secondary | ICD-10-CM | POA: Diagnosis not present

## 2020-02-08 DIAGNOSIS — E876 Hypokalemia: Secondary | ICD-10-CM | POA: Diagnosis not present

## 2020-02-08 DIAGNOSIS — R000 Tachycardia, unspecified: Secondary | ICD-10-CM | POA: Insufficient documentation

## 2020-02-08 DIAGNOSIS — I719 Aortic aneurysm of unspecified site, without rupture: Secondary | ICD-10-CM | POA: Diagnosis not present

## 2020-02-08 DIAGNOSIS — Z9049 Acquired absence of other specified parts of digestive tract: Secondary | ICD-10-CM | POA: Diagnosis not present

## 2020-02-08 DIAGNOSIS — Z20822 Contact with and (suspected) exposure to covid-19: Secondary | ICD-10-CM | POA: Diagnosis not present

## 2020-02-08 DIAGNOSIS — R7989 Other specified abnormal findings of blood chemistry: Secondary | ICD-10-CM | POA: Insufficient documentation

## 2020-02-08 DIAGNOSIS — M542 Cervicalgia: Secondary | ICD-10-CM | POA: Diagnosis not present

## 2020-02-08 DIAGNOSIS — S0990XA Unspecified injury of head, initial encounter: Secondary | ICD-10-CM | POA: Diagnosis not present

## 2020-02-08 DIAGNOSIS — A419 Sepsis, unspecified organism: Secondary | ICD-10-CM | POA: Diagnosis not present

## 2020-02-08 DIAGNOSIS — S199XXA Unspecified injury of neck, initial encounter: Secondary | ICD-10-CM | POA: Diagnosis not present

## 2020-02-09 DIAGNOSIS — E876 Hypokalemia: Secondary | ICD-10-CM | POA: Insufficient documentation

## 2020-02-10 DIAGNOSIS — R338 Other retention of urine: Secondary | ICD-10-CM | POA: Insufficient documentation

## 2020-02-10 DIAGNOSIS — I719 Aortic aneurysm of unspecified site, without rupture: Secondary | ICD-10-CM | POA: Insufficient documentation

## 2020-02-18 DIAGNOSIS — C7800 Secondary malignant neoplasm of unspecified lung: Secondary | ICD-10-CM | POA: Diagnosis not present

## 2020-02-18 DIAGNOSIS — A419 Sepsis, unspecified organism: Secondary | ICD-10-CM | POA: Diagnosis not present

## 2020-02-18 DIAGNOSIS — C189 Malignant neoplasm of colon, unspecified: Secondary | ICD-10-CM | POA: Diagnosis not present

## 2020-02-18 DIAGNOSIS — E876 Hypokalemia: Secondary | ICD-10-CM | POA: Diagnosis not present

## 2020-02-18 DIAGNOSIS — R55 Syncope and collapse: Secondary | ICD-10-CM | POA: Diagnosis not present

## 2020-02-20 DIAGNOSIS — C186 Malignant neoplasm of descending colon: Secondary | ICD-10-CM | POA: Diagnosis not present

## 2020-02-25 ENCOUNTER — Other Ambulatory Visit: Payer: Self-pay | Admitting: *Deleted

## 2020-02-25 ENCOUNTER — Telehealth: Payer: Self-pay | Admitting: *Deleted

## 2020-02-25 NOTE — Telephone Encounter (Signed)
LAST APPOINTMENT DATE: 11/19/2019   NEXT APPOINTMENT DATE: Visit date not found   Rx Alprazolam  LAST REFILL:08/02/2019  QTY: 60 5 rf

## 2020-02-25 NOTE — Telephone Encounter (Signed)
error 

## 2020-02-26 DIAGNOSIS — I951 Orthostatic hypotension: Secondary | ICD-10-CM | POA: Diagnosis not present

## 2020-02-26 DIAGNOSIS — I10 Essential (primary) hypertension: Secondary | ICD-10-CM | POA: Diagnosis not present

## 2020-02-26 DIAGNOSIS — R55 Syncope and collapse: Secondary | ICD-10-CM | POA: Diagnosis not present

## 2020-02-26 DIAGNOSIS — E782 Mixed hyperlipidemia: Secondary | ICD-10-CM | POA: Diagnosis not present

## 2020-02-26 MED ORDER — ALPRAZOLAM 0.5 MG PO TABS: 0.5000 mg | ORAL_TABLET | Freq: Two times a day (BID) | ORAL | 5 refills | Status: DC | PRN

## 2020-03-03 ENCOUNTER — Other Ambulatory Visit: Payer: Medicare Other

## 2020-03-06 DIAGNOSIS — Z01818 Encounter for other preprocedural examination: Secondary | ICD-10-CM | POA: Diagnosis not present

## 2020-03-06 DIAGNOSIS — N390 Urinary tract infection, site not specified: Secondary | ICD-10-CM | POA: Diagnosis not present

## 2020-03-06 DIAGNOSIS — R8290 Unspecified abnormal findings in urine: Secondary | ICD-10-CM | POA: Diagnosis not present

## 2020-03-13 DIAGNOSIS — I10 Essential (primary) hypertension: Secondary | ICD-10-CM | POA: Diagnosis not present

## 2020-03-13 DIAGNOSIS — R338 Other retention of urine: Secondary | ICD-10-CM | POA: Diagnosis not present

## 2020-03-13 DIAGNOSIS — N3289 Other specified disorders of bladder: Secondary | ICD-10-CM | POA: Diagnosis not present

## 2020-03-13 DIAGNOSIS — K219 Gastro-esophageal reflux disease without esophagitis: Secondary | ICD-10-CM | POA: Diagnosis not present

## 2020-03-13 DIAGNOSIS — R350 Frequency of micturition: Secondary | ICD-10-CM | POA: Diagnosis not present

## 2020-03-13 DIAGNOSIS — E7800 Pure hypercholesterolemia, unspecified: Secondary | ICD-10-CM | POA: Diagnosis not present

## 2020-03-13 DIAGNOSIS — Z87891 Personal history of nicotine dependence: Secondary | ICD-10-CM | POA: Diagnosis not present

## 2020-03-14 DIAGNOSIS — K219 Gastro-esophageal reflux disease without esophagitis: Secondary | ICD-10-CM | POA: Diagnosis not present

## 2020-03-14 DIAGNOSIS — Z87891 Personal history of nicotine dependence: Secondary | ICD-10-CM | POA: Diagnosis not present

## 2020-03-14 DIAGNOSIS — E7800 Pure hypercholesterolemia, unspecified: Secondary | ICD-10-CM | POA: Diagnosis not present

## 2020-03-14 DIAGNOSIS — R338 Other retention of urine: Secondary | ICD-10-CM | POA: Diagnosis not present

## 2020-03-14 DIAGNOSIS — N3289 Other specified disorders of bladder: Secondary | ICD-10-CM | POA: Diagnosis not present

## 2020-03-14 DIAGNOSIS — R350 Frequency of micturition: Secondary | ICD-10-CM | POA: Diagnosis not present

## 2020-03-14 DIAGNOSIS — I10 Essential (primary) hypertension: Secondary | ICD-10-CM | POA: Diagnosis not present

## 2020-03-20 DIAGNOSIS — I70203 Unspecified atherosclerosis of native arteries of extremities, bilateral legs: Secondary | ICD-10-CM | POA: Diagnosis not present

## 2020-03-20 DIAGNOSIS — B351 Tinea unguium: Secondary | ICD-10-CM | POA: Diagnosis not present

## 2020-03-20 DIAGNOSIS — L259 Unspecified contact dermatitis, unspecified cause: Secondary | ICD-10-CM | POA: Diagnosis not present

## 2020-03-20 DIAGNOSIS — L84 Corns and callosities: Secondary | ICD-10-CM | POA: Diagnosis not present

## 2020-03-25 DIAGNOSIS — R338 Other retention of urine: Secondary | ICD-10-CM | POA: Diagnosis not present

## 2020-04-03 DIAGNOSIS — R0600 Dyspnea, unspecified: Secondary | ICD-10-CM | POA: Diagnosis not present

## 2020-04-03 DIAGNOSIS — Z85118 Personal history of other malignant neoplasm of bronchus and lung: Secondary | ICD-10-CM | POA: Diagnosis not present

## 2020-04-03 DIAGNOSIS — Z85038 Personal history of other malignant neoplasm of large intestine: Secondary | ICD-10-CM | POA: Diagnosis not present

## 2020-04-07 DIAGNOSIS — I10 Essential (primary) hypertension: Secondary | ICD-10-CM | POA: Diagnosis not present

## 2020-04-07 DIAGNOSIS — E876 Hypokalemia: Secondary | ICD-10-CM | POA: Diagnosis not present

## 2020-04-07 DIAGNOSIS — J189 Pneumonia, unspecified organism: Secondary | ICD-10-CM | POA: Diagnosis not present

## 2020-04-07 DIAGNOSIS — R55 Syncope and collapse: Secondary | ICD-10-CM | POA: Diagnosis not present

## 2020-04-22 ENCOUNTER — Telehealth (INDEPENDENT_AMBULATORY_CARE_PROVIDER_SITE_OTHER): Payer: Medicare Other | Admitting: Family Medicine

## 2020-04-22 DIAGNOSIS — F1010 Alcohol abuse, uncomplicated: Secondary | ICD-10-CM | POA: Diagnosis not present

## 2020-04-22 DIAGNOSIS — N400 Enlarged prostate without lower urinary tract symptoms: Secondary | ICD-10-CM

## 2020-04-22 DIAGNOSIS — M4722 Other spondylosis with radiculopathy, cervical region: Secondary | ICD-10-CM | POA: Diagnosis not present

## 2020-04-22 DIAGNOSIS — F419 Anxiety disorder, unspecified: Secondary | ICD-10-CM | POA: Diagnosis not present

## 2020-04-22 DIAGNOSIS — M5441 Lumbago with sciatica, right side: Secondary | ICD-10-CM | POA: Diagnosis not present

## 2020-04-22 MED ORDER — DIAZEPAM 5 MG PO TABS
5.0000 mg | ORAL_TABLET | Freq: Two times a day (BID) | ORAL | 1 refills | Status: DC | PRN
Start: 2020-04-22 — End: 2020-08-01

## 2020-04-22 NOTE — Assessment & Plan Note (Signed)
Encouraged continued abstinence.  Discussed association between benzodiazepines and alcohol use.  Given that he is still having ongoing issues with severe anxiety and withdrawal effects we will continue benzo for now.  We will switch from Xanax to longer half life benzo with Valium.  Will place referral to psychiatry as well.

## 2020-04-22 NOTE — Progress Notes (Signed)
Chief Complaint:  Jesus [name] is a 74 y.o. male who presents today for a virtual TCM visit.  Assessment/Plan:  Chronic Problems Addressed Today: Alcohol abuse Encouraged continued abstinence.  Discussed association between benzodiazepines and alcohol use.  Given that he is still having ongoing issues with severe anxiety and withdrawal effects we will continue benzo for now.  We will switch from Xanax to longer half life benzo with Valium.  Will place referral to psychiatry as well.  Anxiety See above.  Anxiety is worsened since he has stopped drinking.  Encouraged continued alcohol abstinence.  We will switch from Xanax to Valium.  Hopefully this will help more with his withdrawal side effects as well.  Will place referral to psychiatry  BPH (benign prostatic hyperplasia) s/p TURP 2021 Continue management per urology.     Subjective:  HPI:  Summary of Hospital admission: Reason for admission: Alcohol detox Date of admission: 04/17/2020 Date of discharge: 04/19/2020 Date of Interactive contact: N/A. Today is within 2 business days of discharge.  Summary of Hospital course: Patient presented to the ED on 04/17/2020 for alcohol detox.  On arrival blood alcohol level was 239.  He was started on Ativan taper.  He did well have a couple of days and was tapered off on hospital day 2.  Did not have any withdrawal symptoms.  He was discharged in stable condition.  There was plan for him to follow-up with [facility].  Interim history:   He has been relatively well since being discharged.  He has not had any alcoholic drinks for the past week.  He has been taking Valium to help manage his withdrawal symptoms.  He is taking 0.5 mg 3-4 times daily.  Has had worsening anxiety since trying to stop alcohol as well.  He was planning on following up with [facility].  Unfortunately due to balance issues he was told that he would not be a good candidate there.  He is asking for medical  assistance to help him manage his withdrawal.  He has been on several benzos in the past including Ativan and Valium.  He thinks that nothing is worked very well to help manage his anxiety several years ago.  Since our last visit he has also underwent TURP.  He is still having quite a bit of urinary frequency but there is other lower urinary tract symptoms have improved.  Is also been having issues with bursitis.  Saw orthopedics today received cortisone shot which seems to be helping.  ROS: Per HPI, otherwise a complete review of systems was negative.   PMH:  The following were reviewed and entered/updated in epic: Past Medical History:  Diagnosis Date  . Arthritis   . Cancer ([place])   . Colon cancer ([place])    2000  . Colon polyp   . GERD (gastroesophageal reflux disease)   . Hyperlipidemia   . Hypertension   . Lung cancer [facility])    2010   Patient Active Problem List   Diagnosis Date Noted  . Alcohol abuse 04/22/2020  . BPH (benign prostatic hyperplasia) s/p TURP 2021 04/22/2020  . Essential hypertension 06/29/2019  . Dyslipidemia 06/29/2019  . GERD (gastroesophageal reflux disease) 06/29/2019  . Anxiety 06/29/2019  . Lung cancer [facility]) s/p resection in 2010 06/29/2019  . Rectal cancer [facility])    Past Surgical History:  Procedure Laterality Date  . ANKLE FUSION     crushed ankle while in high school  . APPENDECTOMY  15's    Family History  Problem Relation Age of Onset  . Heart disease Father   . Stroke Brother   . Breast cancer Sister   . Breast cancer Daughter   . Prostate cancer Neg Hx     Medications- Reconciled discharge and current medications in Epic.  Current Outpatient Medications  Medication Sig Dispense Refill  . atorvastatin (LIPITOR) 20 MG tablet Take 1 tablet (20 mg total) by mouth daily. 90 tablet 3  . B Complex Vitamins (B COMPLEX PO) Take by mouth.    . citalopram (CELEXA) 20 MG tablet Take by mouth.    . Cyanocobalamin (VITAMIN B-12 PO)  Take by mouth.    . hydrochlorothiazide (HYDRODIURIL) 25 MG tablet Take 1 tablet (25 mg total) by mouth daily. 90 tablet 1  . losartan (COZAAR) 50 MG tablet TAKE 1 TABLET(50 MG) BY MOUTH DAILY 90 tablet 1  . Multiple Vitamin (MULTIVITAMIN PO) Take by mouth.    . Multiple Vitamins-Minerals (ZINC PO) Take by mouth.    Marland Kitchen omeprazole (PRILOSEC) 20 MG capsule TAKE 1 CAPSULE(20 MG) BY MOUTH DAILY 90 capsule 0  . Apoaequorin (PREVAGEN PO) Take by mouth.    . diazepam (VALIUM) 5 MG tablet Take 1 tablet (5 mg total) by mouth every 12 (twelve) hours as needed for anxiety. 60 tablet 1  . tamsulosin (FLOMAX) 0.4 MG CAPS capsule TAKE 1 CAPSULE BY MOUTH EVERY DAY 1/2 HOUR AFTER SAME MEAL    . VITAMIN D PO Take 800 Units by mouth.      No current facility-administered medications for this visit.    Allergies-reviewed and updated No Known Allergies  Social History   Socioeconomic History  . Marital status: Married    Spouse name: Not on file  . Number of children: Not on file  . Years of education: Not on file  . Highest education level: Not on file  Occupational History  . Occupation: Retired   Tobacco Use  . Smoking status: Former Smoker    Quit date: 2010    Years since quitting: 11.4  . Smokeless tobacco: Never Used  Substance and Sexual Activity  . Alcohol use: Yes  . Drug use: No  . Sexual activity: Not on file  Other Topics Concern  . Not on file  Social History Narrative   Lives part of the time in [place] and is followed by the [facility] there   Social Determinants of Health   Financial Resource Strain:   . Difficulty of Paying Living Expenses:   Food Insecurity:   . Worried About Charity fundraiser in the Last Year:   . Arboriculturist in the Last Year:   Transportation Needs:   . Film/video editor (Medical):   Marland Kitchen Lack of Transportation (Non-Medical):   Physical Activity: Unknown  . Days of Exercise per Week: 3 days  . Minutes of Exercise per Session:  Not on file  Stress:   . Feeling of Stress :   Social Connections:   . Frequency of Communication with Friends and Family:   . Frequency of Social Gatherings with Friends and Family:   . Attends Religious Services:   . Active Member of Clubs or Organizations:   . Attends Archivist Meetings:   Marland Kitchen Marital Status:         Objective:  Physical Exam: There were no vitals taken for this visit.  Gen: NAD, resting comfortably  Pulm: NWOB Neuro: Grossly normal, moves all  extremities Psych: Normal affect and thought content   Virtual Visit via Video   I connected with [name] on 04/22/20 at  4:00 PM EDT by a video enabled telemedicine application and verified that I am speaking with the correct person using two identifiers. The limitations of evaluation and management by telemedicine and the availability of in person appointments were discussed. The patient expressed understanding and agreed to proceed.   Patient location: Home Provider location: [place] participating in the virtual visit: Myself and Patient     Algis Greenhouse. [name], MD 04/22/2020 4:09 PM

## 2020-04-22 NOTE — Assessment & Plan Note (Signed)
Continue management per urology. 

## 2020-04-22 NOTE — Assessment & Plan Note (Signed)
See above.  Anxiety is worsened since he has stopped drinking.  Encouraged continued alcohol abstinence.  We will switch from Xanax to Valium.  Hopefully this will help more with his withdrawal side effects as well.  Will place referral to psychiatry

## 2020-04-23 DIAGNOSIS — M7061 Trochanteric bursitis, right hip: Secondary | ICD-10-CM | POA: Diagnosis not present

## 2020-04-30 DIAGNOSIS — M6281 Muscle weakness (generalized): Secondary | ICD-10-CM | POA: Diagnosis not present

## 2020-04-30 DIAGNOSIS — M7061 Trochanteric bursitis, right hip: Secondary | ICD-10-CM | POA: Diagnosis not present

## 2020-05-01 DIAGNOSIS — M7061 Trochanteric bursitis, right hip: Secondary | ICD-10-CM | POA: Diagnosis not present

## 2020-05-01 DIAGNOSIS — M6281 Muscle weakness (generalized): Secondary | ICD-10-CM | POA: Diagnosis not present

## 2020-05-05 DIAGNOSIS — M7061 Trochanteric bursitis, right hip: Secondary | ICD-10-CM | POA: Diagnosis not present

## 2020-05-05 DIAGNOSIS — M6281 Muscle weakness (generalized): Secondary | ICD-10-CM | POA: Diagnosis not present

## 2020-05-06 ENCOUNTER — Telehealth: Payer: Self-pay

## 2020-05-06 DIAGNOSIS — F419 Anxiety disorder, unspecified: Secondary | ICD-10-CM

## 2020-05-06 NOTE — Telephone Encounter (Signed)
Please Advise

## 2020-05-06 NOTE — Telephone Encounter (Signed)
Ok with alternate referral.  Algis Greenhouse. [name], MD 05/06/2020 11:50 AM

## 2020-05-06 NOTE — Telephone Encounter (Signed)
Patient would like a referral to somewhere different than [facility]. He called them and did not like the way they handled things.

## 2020-05-06 NOTE — Addendum Note (Signed)
Addended bySerita Sheller on: 05/06/2020 03:02 PM   Modules accepted: Orders

## 2020-05-06 NOTE — Telephone Encounter (Signed)
Alternate referral placed.

## 2020-05-13 DIAGNOSIS — M5441 Lumbago with sciatica, right side: Secondary | ICD-10-CM | POA: Diagnosis not present

## 2020-05-13 DIAGNOSIS — M7061 Trochanteric bursitis, right hip: Secondary | ICD-10-CM | POA: Diagnosis not present

## 2020-05-13 DIAGNOSIS — M545 Low back pain: Secondary | ICD-10-CM | POA: Diagnosis not present

## 2020-05-16 DIAGNOSIS — M7061 Trochanteric bursitis, right hip: Secondary | ICD-10-CM | POA: Diagnosis not present

## 2020-06-26 DIAGNOSIS — I10 Essential (primary) hypertension: Secondary | ICD-10-CM | POA: Diagnosis not present

## 2020-06-26 DIAGNOSIS — I712 Thoracic aortic aneurysm, without rupture: Secondary | ICD-10-CM | POA: Diagnosis not present

## 2020-06-26 DIAGNOSIS — Z01810 Encounter for preprocedural cardiovascular examination: Secondary | ICD-10-CM | POA: Diagnosis not present

## 2020-06-26 DIAGNOSIS — E782 Mixed hyperlipidemia: Secondary | ICD-10-CM | POA: Diagnosis not present

## 2020-07-01 DIAGNOSIS — I10 Essential (primary) hypertension: Secondary | ICD-10-CM | POA: Diagnosis not present

## 2020-07-01 DIAGNOSIS — R338 Other retention of urine: Secondary | ICD-10-CM | POA: Diagnosis not present

## 2020-07-07 DIAGNOSIS — Z961 Presence of intraocular lens: Secondary | ICD-10-CM | POA: Diagnosis not present

## 2020-07-07 DIAGNOSIS — H26493 Other secondary cataract, bilateral: Secondary | ICD-10-CM | POA: Diagnosis not present

## 2020-07-21 ENCOUNTER — Other Ambulatory Visit: Payer: Medicare Other

## 2020-07-21 DIAGNOSIS — Z20822 Contact with and (suspected) exposure to covid-19: Secondary | ICD-10-CM | POA: Diagnosis not present

## 2020-07-22 ENCOUNTER — Telehealth: Payer: Medicare Other | Admitting: Family Medicine

## 2020-07-22 LAB — SARS-COV-2, NAA 2 DAY TAT

## 2020-07-22 LAB — NOVEL CORONAVIRUS, NAA: SARS-CoV-2, NAA: NOT DETECTED

## 2020-07-24 LAB — PSA: PSA: 0.25

## 2020-07-28 ENCOUNTER — Encounter: Payer: Self-pay | Admitting: Family Medicine

## 2020-07-28 ENCOUNTER — Other Ambulatory Visit: Payer: Self-pay | Admitting: Family Medicine

## 2020-07-29 NOTE — Telephone Encounter (Signed)
LVM to patient, need ov with PCP for future refills

## 2020-07-29 NOTE — Telephone Encounter (Signed)
Please ask patient to schedule appointment.  Jesus [name]. [name], MD 07/29/2020 8:29 AM

## 2020-07-29 NOTE — Telephone Encounter (Signed)
LAST APPOINTMENT DATE: 11/19/2019   NEXT APPOINTMENT DATE:

## 2020-08-01 ENCOUNTER — Ambulatory Visit (INDEPENDENT_AMBULATORY_CARE_PROVIDER_SITE_OTHER): Payer: Medicare Other | Admitting: Family Medicine

## 2020-08-01 ENCOUNTER — Encounter: Payer: Self-pay | Admitting: Family Medicine

## 2020-08-01 ENCOUNTER — Other Ambulatory Visit: Payer: Self-pay

## 2020-08-01 VITALS — BP 138/89 | HR 71 | Temp 97.9°F | Ht 71.0 in | Wt 209.4 lb

## 2020-08-01 DIAGNOSIS — Z23 Encounter for immunization: Secondary | ICD-10-CM

## 2020-08-01 DIAGNOSIS — F419 Anxiety disorder, unspecified: Secondary | ICD-10-CM

## 2020-08-01 DIAGNOSIS — E785 Hyperlipidemia, unspecified: Secondary | ICD-10-CM | POA: Diagnosis not present

## 2020-08-01 DIAGNOSIS — I10 Essential (primary) hypertension: Secondary | ICD-10-CM | POA: Diagnosis not present

## 2020-08-01 DIAGNOSIS — N400 Enlarged prostate without lower urinary tract symptoms: Secondary | ICD-10-CM | POA: Diagnosis not present

## 2020-08-01 MED ORDER — LOSARTAN POTASSIUM 50 MG PO TABS: ORAL_TABLET | ORAL | 1 refills | Status: AC

## 2020-08-01 NOTE — Assessment & Plan Note (Addendum)
Last lipids were done in [place] about a month ago.  This is available in Care Everywhere.  Numbers are at goal.  We will continue lipitor 20mg  daily.

## 2020-08-01 NOTE — Assessment & Plan Note (Signed)
Stable.  Continue Flomax per urology.

## 2020-08-01 NOTE — Assessment & Plan Note (Signed)
Stable.  Did not tolerate Valium.  We will continue Xanax and Celexa.

## 2020-08-01 NOTE — Progress Notes (Signed)
   Jesus [name] is a 74 y.o. male who presents today for an office visit.  Assessment/Plan:  New/Acute Problems: Diarrhea Resolved.  Likely viral infection.  Discussed reasons to return to care.  Chronic Problems Addressed Today: BPH (benign prostatic hyperplasia) s/p TURP 2021 Stable.  Continue Flomax per urology.  Anxiety Stable.  Did not tolerate Valium.  We will continue Xanax and Celexa.  Dyslipidemia Last lipids were done in [place] about a month ago.  This is available in Care Everywhere.  Numbers are at goal.  We will continue lipitor 20mg  daily.   Essential hypertension At goal.  Continue HCTZ 25 mg daily and losartan 25 mg daily.     Subjective:  HPI:  Patient here for follow-up.  Had a day of diarrhea about a week ago.  Resolved after about 24 hours.  He is otherwise doing well.  Since our last visit he has been spending most of his time in [place].  He was admitted to the hospital 2 days with pneumonia.  Has recovered and is now doing well.  Will be planning on going back to for the next week.  Please see A/P for status of chronic conditions.       Objective:  Physical Exam: BP 138/89   Pulse 71   Temp 97.9 F (36.6 C) (Temporal)   Ht 5\' 11"  (1.803 m)   Wt 209 lb 6.4 oz (95 kg)   SpO2 98%   BMI 29.21 kg/m   Gen: No acute distress, resting comfortably CV: Regular rate and rhythm with no murmurs appreciated Pulm: Normal work of breathing, clear to auscultation bilaterally with no crackles, wheezes, or rhonchi Neuro: Grossly normal, moves all extremities Psych: Normal affect and thought content      Natalyah Cummiskey M. [name], MD 08/01/2020 1:24 PM

## 2020-08-01 NOTE — Patient Instructions (Signed)
It was very nice to see you today!  I am glad that you are feeling better.  We will give you a flu vaccine today.  No other changes today.  I will see back in 6 to 12 months.  Please come back to see me sooner if needed.  Take care, Dr [name]  Please try these tips to maintain a healthy lifestyle:   Eat at least 3 REAL meals and 1-2 snacks per day.  Aim for no more than 5 hours between eating.  If you eat breakfast, please do so within one hour of getting up.    Each meal should contain half fruits/vegetables, one quarter protein, and one quarter carbs (no bigger than a computer mouse)   Cut down on sweet beverages. This includes juice, soda, and sweet tea.     Drink at least 1 glass of water with each meal and aim for at least 8 glasses per day   Exercise at least 150 minutes every week.

## 2020-08-01 NOTE — Assessment & Plan Note (Signed)
At goal.  Continue HCTZ 25 mg daily and losartan 25 mg daily.

## 2020-08-14 DIAGNOSIS — R0600 Dyspnea, unspecified: Secondary | ICD-10-CM | POA: Diagnosis not present

## 2020-08-20 DIAGNOSIS — Z85118 Personal history of other malignant neoplasm of bronchus and lung: Secondary | ICD-10-CM | POA: Diagnosis not present

## 2020-08-20 DIAGNOSIS — R0600 Dyspnea, unspecified: Secondary | ICD-10-CM | POA: Diagnosis not present

## 2020-09-14 DIAGNOSIS — Z902 Acquired absence of lung [part of]: Secondary | ICD-10-CM | POA: Diagnosis not present

## 2020-09-14 DIAGNOSIS — K831 Obstruction of bile duct: Secondary | ICD-10-CM | POA: Diagnosis not present

## 2020-09-14 DIAGNOSIS — K219 Gastro-esophageal reflux disease without esophagitis: Secondary | ICD-10-CM | POA: Diagnosis not present

## 2020-09-14 DIAGNOSIS — K8080 Other cholelithiasis without obstruction: Secondary | ICD-10-CM | POA: Diagnosis not present

## 2020-09-14 DIAGNOSIS — E871 Hypo-osmolality and hyponatremia: Secondary | ICD-10-CM | POA: Diagnosis not present

## 2020-09-14 DIAGNOSIS — E873 Alkalosis: Secondary | ICD-10-CM | POA: Diagnosis not present

## 2020-09-14 DIAGNOSIS — R7401 Elevation of levels of liver transaminase levels: Secondary | ICD-10-CM | POA: Diagnosis not present

## 2020-09-14 DIAGNOSIS — R7989 Other specified abnormal findings of blood chemistry: Secondary | ICD-10-CM | POA: Diagnosis not present

## 2020-09-14 DIAGNOSIS — I77810 Thoracic aortic ectasia: Secondary | ICD-10-CM | POA: Diagnosis not present

## 2020-09-14 DIAGNOSIS — Z9221 Personal history of antineoplastic chemotherapy: Secondary | ICD-10-CM | POA: Diagnosis not present

## 2020-09-14 DIAGNOSIS — Z20822 Contact with and (suspected) exposure to covid-19: Secondary | ICD-10-CM | POA: Diagnosis not present

## 2020-09-14 DIAGNOSIS — I10 Essential (primary) hypertension: Secondary | ICD-10-CM | POA: Diagnosis not present

## 2020-09-14 DIAGNOSIS — K8020 Calculus of gallbladder without cholecystitis without obstruction: Secondary | ICD-10-CM | POA: Diagnosis not present

## 2020-09-14 DIAGNOSIS — Z87891 Personal history of nicotine dependence: Secondary | ICD-10-CM | POA: Diagnosis not present

## 2020-09-14 DIAGNOSIS — E872 Acidosis: Secondary | ICD-10-CM | POA: Diagnosis not present

## 2020-09-14 DIAGNOSIS — E785 Hyperlipidemia, unspecified: Secondary | ICD-10-CM | POA: Diagnosis not present

## 2020-09-14 DIAGNOSIS — R6520 Severe sepsis without septic shock: Secondary | ICD-10-CM | POA: Diagnosis not present

## 2020-09-14 DIAGNOSIS — Z923 Personal history of irradiation: Secondary | ICD-10-CM | POA: Diagnosis not present

## 2020-09-14 DIAGNOSIS — Z85118 Personal history of other malignant neoplasm of bronchus and lung: Secondary | ICD-10-CM | POA: Diagnosis not present

## 2020-09-14 DIAGNOSIS — D696 Thrombocytopenia, unspecified: Secondary | ICD-10-CM | POA: Diagnosis not present

## 2020-09-14 DIAGNOSIS — R17 Unspecified jaundice: Secondary | ICD-10-CM | POA: Diagnosis not present

## 2020-09-14 DIAGNOSIS — Z85038 Personal history of other malignant neoplasm of large intestine: Secondary | ICD-10-CM | POA: Diagnosis not present

## 2020-09-14 DIAGNOSIS — R000 Tachycardia, unspecified: Secondary | ICD-10-CM | POA: Diagnosis not present

## 2020-09-14 DIAGNOSIS — Z9049 Acquired absence of other specified parts of digestive tract: Secondary | ICD-10-CM | POA: Diagnosis not present

## 2020-09-14 DIAGNOSIS — K760 Fatty (change of) liver, not elsewhere classified: Secondary | ICD-10-CM | POA: Diagnosis not present

## 2020-09-14 DIAGNOSIS — R1013 Epigastric pain: Secondary | ICD-10-CM | POA: Diagnosis not present

## 2020-09-14 DIAGNOSIS — R109 Unspecified abdominal pain: Secondary | ICD-10-CM | POA: Diagnosis not present

## 2020-09-15 DIAGNOSIS — K8020 Calculus of gallbladder without cholecystitis without obstruction: Secondary | ICD-10-CM | POA: Diagnosis not present

## 2020-09-15 DIAGNOSIS — R7401 Elevation of levels of liver transaminase levels: Secondary | ICD-10-CM | POA: Diagnosis not present

## 2020-09-15 DIAGNOSIS — I10 Essential (primary) hypertension: Secondary | ICD-10-CM | POA: Diagnosis not present

## 2020-09-15 DIAGNOSIS — K831 Obstruction of bile duct: Secondary | ICD-10-CM | POA: Diagnosis not present

## 2020-09-15 DIAGNOSIS — R1013 Epigastric pain: Secondary | ICD-10-CM | POA: Diagnosis not present

## 2020-09-16 DIAGNOSIS — R7401 Elevation of levels of liver transaminase levels: Secondary | ICD-10-CM | POA: Diagnosis not present

## 2020-09-16 DIAGNOSIS — K831 Obstruction of bile duct: Secondary | ICD-10-CM | POA: Diagnosis not present

## 2020-09-16 DIAGNOSIS — K8020 Calculus of gallbladder without cholecystitis without obstruction: Secondary | ICD-10-CM | POA: Diagnosis not present

## 2020-09-16 DIAGNOSIS — R1013 Epigastric pain: Secondary | ICD-10-CM | POA: Diagnosis not present

## 2020-09-23 DIAGNOSIS — R109 Unspecified abdominal pain: Secondary | ICD-10-CM | POA: Diagnosis not present

## 2020-09-23 DIAGNOSIS — K8050 Calculus of bile duct without cholangitis or cholecystitis without obstruction: Secondary | ICD-10-CM | POA: Diagnosis not present

## 2020-09-23 DIAGNOSIS — R509 Fever, unspecified: Secondary | ICD-10-CM | POA: Diagnosis not present

## 2020-09-29 DIAGNOSIS — K8050 Calculus of bile duct without cholangitis or cholecystitis without obstruction: Secondary | ICD-10-CM | POA: Diagnosis not present

## 2020-09-29 DIAGNOSIS — K838 Other specified diseases of biliary tract: Secondary | ICD-10-CM | POA: Diagnosis not present

## 2020-09-29 DIAGNOSIS — K862 Cyst of pancreas: Secondary | ICD-10-CM | POA: Diagnosis not present

## 2020-09-29 DIAGNOSIS — K831 Obstruction of bile duct: Secondary | ICD-10-CM | POA: Diagnosis not present

## 2020-09-29 DIAGNOSIS — I10 Essential (primary) hypertension: Secondary | ICD-10-CM | POA: Diagnosis not present

## 2020-10-07 ENCOUNTER — Ambulatory Visit (INDEPENDENT_AMBULATORY_CARE_PROVIDER_SITE_OTHER): Payer: Medicare Other | Admitting: Family Medicine

## 2020-10-07 ENCOUNTER — Other Ambulatory Visit: Payer: Self-pay

## 2020-10-07 ENCOUNTER — Encounter: Payer: Self-pay | Admitting: Family Medicine

## 2020-10-07 VITALS — BP 128/83 | HR 73 | Temp 98.0°F | Ht 71.0 in | Wt 202.4 lb

## 2020-10-07 DIAGNOSIS — N400 Enlarged prostate without lower urinary tract symptoms: Secondary | ICD-10-CM

## 2020-10-07 DIAGNOSIS — K8020 Calculus of gallbladder without cholecystitis without obstruction: Secondary | ICD-10-CM

## 2020-10-07 DIAGNOSIS — I10 Essential (primary) hypertension: Secondary | ICD-10-CM | POA: Diagnosis not present

## 2020-10-07 DIAGNOSIS — E785 Hyperlipidemia, unspecified: Secondary | ICD-10-CM | POA: Diagnosis not present

## 2020-10-07 DIAGNOSIS — L570 Actinic keratosis: Secondary | ICD-10-CM | POA: Diagnosis not present

## 2020-10-07 MED ORDER — POTASSIUM CHLORIDE CRYS ER 20 MEQ PO TBCR: 20.0000 meq | EXTENDED_RELEASE_TABLET | Freq: Every day | ORAL | 3 refills | Status: DC

## 2020-10-07 NOTE — Patient Instructions (Signed)
It was very nice to see you today!  I am glad that you are feeling better.  You should have your gallbladder taken out soon.  Please come back in a couple of weeks to have the spot on your face present.  I will refill your potassium today.  Take care, Dr [name]  Please try these tips to maintain a healthy lifestyle:   Eat at least 3 REAL meals and 1-2 snacks per day.  Aim for no more than 5 hours between eating.  If you eat breakfast, please do so within one hour of getting up.    Each meal should contain half fruits/vegetables, one quarter protein, and one quarter carbs (no bigger than a computer mouse)   Cut down on sweet beverages. This includes juice, soda, and sweet tea.     Drink at least 1 glass of water with each meal and aim for at least 8 glasses per day   Exercise at least 150 minutes every week.

## 2020-10-07 NOTE — Assessment & Plan Note (Signed)
Stable on Flomax.

## 2020-10-07 NOTE — Assessment & Plan Note (Signed)
Attempted to perform cryotherapy today however we are out of stock in the office today.  Advised him to come back here in the next 1 to 2 weeks or follow-up with dermatology soon he voiced understanding.

## 2020-10-07 NOTE — Assessment & Plan Note (Signed)
At goal.  Continue HCTZ 25 mg daily and losartan 25 mg daily.  Refill potassium today.

## 2020-10-07 NOTE — Progress Notes (Signed)
   Jesus [name] is a 74 y.o. male who presents today for an office visit.  Assessment/Plan:  New/Acute Problems: Gallstones Results available in Care Everywhere.  Will need cholecystectomy soon.  Patient would like to wait until he goes back to [place] in a couple weeks.  He is not currently symptomatic.  Discussed reasons to return to care and seek emergent care.  Chronic Problems Addressed Today: Actinic keratosis Attempted to perform cryotherapy today however we are out of stock in the office today.  Advised him to come back here in the next 1 to 2 weeks or follow-up with dermatology soon he voiced understanding.  BPH (benign prostatic hyperplasia) s/p TURP 2021 Stable on Flomax.  Dyslipidemia Stable.  Continue Lipitor 20 mg daily.  Essential hypertension At goal.  Continue HCTZ 25 mg daily and losartan 25 mg daily.  Refill potassium today.     Subjective:  HPI:  Patient here for follow-up.  Was last seen about 2 months ago.  Since her last visit he was hospitalized in [place] for symptomatic gallstones.  Presented to the ED with abdominal [name] dizzy S and nausea.  Had CT scan which showed gallbladder sludge and stones.  Was also found to have significantly elevated LFTs.  Underwent ERCP while the hospital did not have any significant findings.  Stent placed at that time.  Underwent endoscopy again 8 days ago and had stone removed.  Stents were also removed at that time.  Had cytology performed which has not yet resulted.  He has done well since being discharged from the hospital.  No recurrent abdominal [name].  No nausea or vomiting.  No fevers or chills.       Objective:  Physical Exam: BP 128/83   Pulse 73   Temp 98 F (36.7 C) (Temporal)   Ht 5\' 11"  (1.803 m)   Wt 202 lb 6.4 oz (91.8 kg)   SpO2 97%   BMI 28.23 kg/m   Gen: No acute distress, resting comfortably CV: Regular rate and rhythm with no murmurs appreciated Pulm: Normal work of breathing, clear to  auscultation bilaterally with no crackles, wheezes, or rhonchi GI: S, NT, ND Skin: AK on left lower eyelid Neuro: Grossly normal, moves all extremities Psych: Normal affect and thought content  Time Spent: 45 minutes of total time was spent on the date of the encounter performing the following actions: chart review prior to seeing the patient including recent hospitalization in [place], obtaining history, performing a medically necessary exam, counseling on the treatment plan, placing orders, and documenting in our EHR.        Jesus [name]. Jesus Pain, MD 10/07/2020 1:58 PM

## 2020-10-07 NOTE — Assessment & Plan Note (Signed)
Stable.  Continue Lipitor 20 mg daily.

## 2020-10-20 DIAGNOSIS — R7989 Other specified abnormal findings of blood chemistry: Secondary | ICD-10-CM | POA: Diagnosis not present

## 2020-10-28 DIAGNOSIS — R55 Syncope and collapse: Secondary | ICD-10-CM | POA: Diagnosis not present

## 2020-10-28 DIAGNOSIS — E7800 Pure hypercholesterolemia, unspecified: Secondary | ICD-10-CM | POA: Diagnosis not present

## 2020-10-28 DIAGNOSIS — K8050 Calculus of bile duct without cholangitis or cholecystitis without obstruction: Secondary | ICD-10-CM | POA: Diagnosis not present

## 2020-10-28 DIAGNOSIS — Z85118 Personal history of other malignant neoplasm of bronchus and lung: Secondary | ICD-10-CM | POA: Diagnosis not present

## 2020-10-28 DIAGNOSIS — I10 Essential (primary) hypertension: Secondary | ICD-10-CM | POA: Diagnosis not present

## 2020-10-28 DIAGNOSIS — Z85038 Personal history of other malignant neoplasm of large intestine: Secondary | ICD-10-CM | POA: Diagnosis not present

## 2020-10-28 DIAGNOSIS — E876 Hypokalemia: Secondary | ICD-10-CM | POA: Diagnosis not present

## 2020-10-28 DIAGNOSIS — Z0000 Encounter for general adult medical examination without abnormal findings: Secondary | ICD-10-CM | POA: Diagnosis not present

## 2020-11-07 DIAGNOSIS — K8050 Calculus of bile duct without cholangitis or cholecystitis without obstruction: Secondary | ICD-10-CM | POA: Diagnosis not present

## 2020-11-11 DIAGNOSIS — Z01818 Encounter for other preprocedural examination: Secondary | ICD-10-CM | POA: Diagnosis not present

## 2020-11-11 DIAGNOSIS — K8050 Calculus of bile duct without cholangitis or cholecystitis without obstruction: Secondary | ICD-10-CM | POA: Diagnosis not present

## 2020-11-11 DIAGNOSIS — Z01810 Encounter for preprocedural cardiovascular examination: Secondary | ICD-10-CM | POA: Diagnosis not present

## 2020-11-19 DIAGNOSIS — K8064 Calculus of gallbladder and bile duct with chronic cholecystitis without obstruction: Secondary | ICD-10-CM | POA: Diagnosis not present

## 2020-11-19 DIAGNOSIS — Z87891 Personal history of nicotine dependence: Secondary | ICD-10-CM | POA: Diagnosis not present

## 2020-11-19 DIAGNOSIS — K8020 Calculus of gallbladder without cholecystitis without obstruction: Secondary | ICD-10-CM | POA: Diagnosis not present

## 2020-11-19 DIAGNOSIS — I10 Essential (primary) hypertension: Secondary | ICD-10-CM | POA: Diagnosis not present

## 2020-11-21 IMAGING — DX DG RIBS W/ CHEST 3+V*L*
3 series · 3 of 3 positions shown · non-contrast
Comparison: CT scans of the chest dated 10/24/2019 and 07/27/2019
and chest x-ray 07/09/2019

CLINICAL DATA: Left anterior rib pain since a fall 5 days ago.

EXAM:
LEFT RIBS AND CHEST - 3+ VIEW

[chest pa]
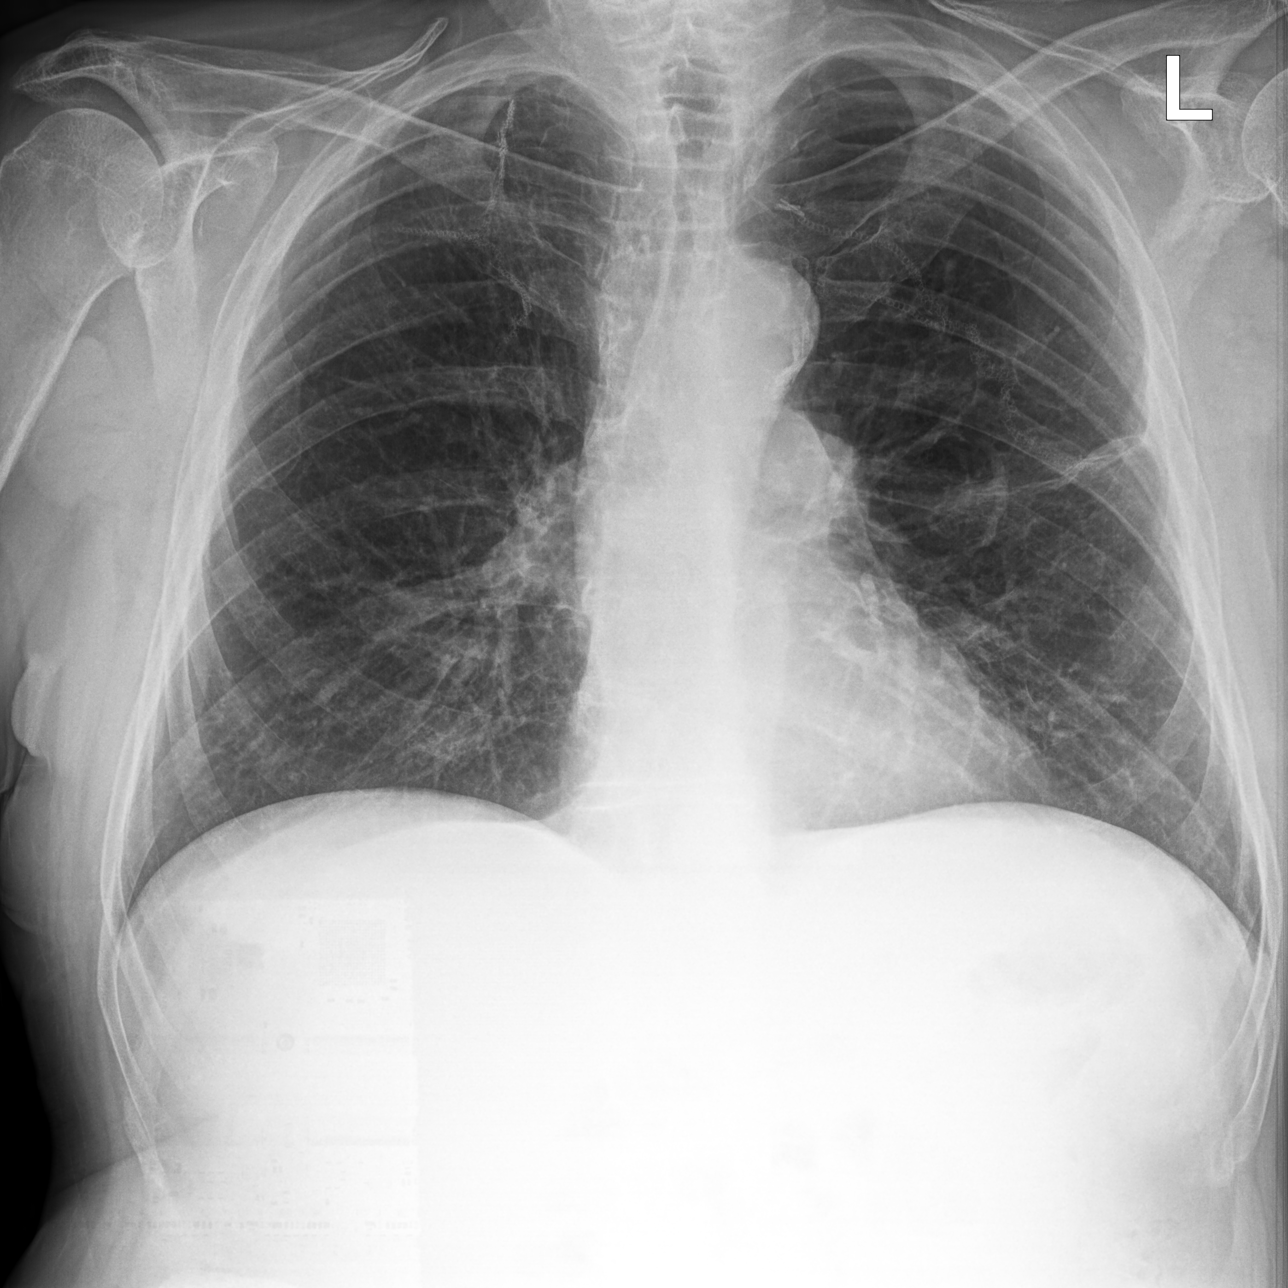

[hemithorax (ribs) ap]
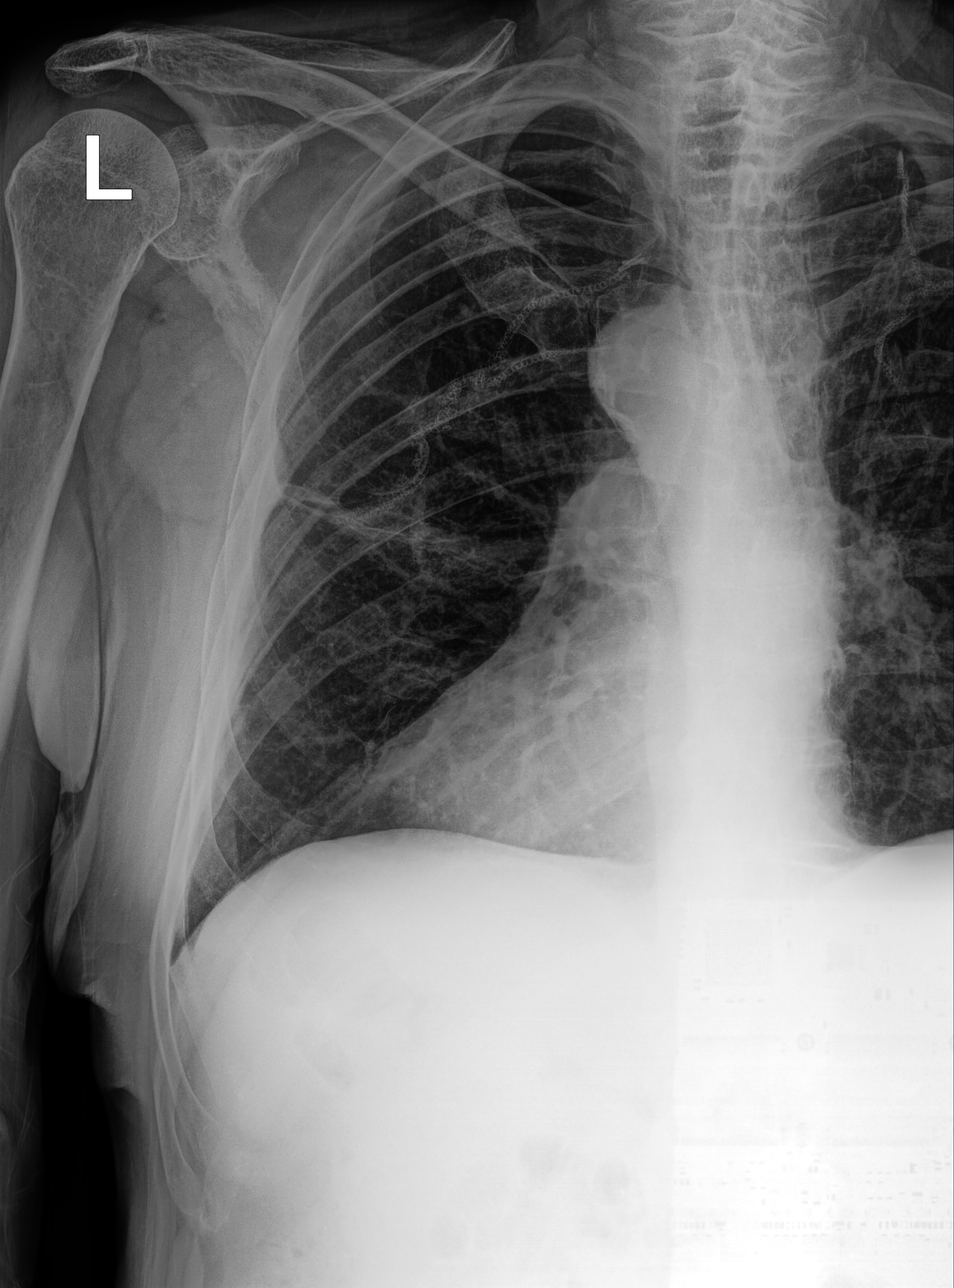

[oblique ribs oblique]
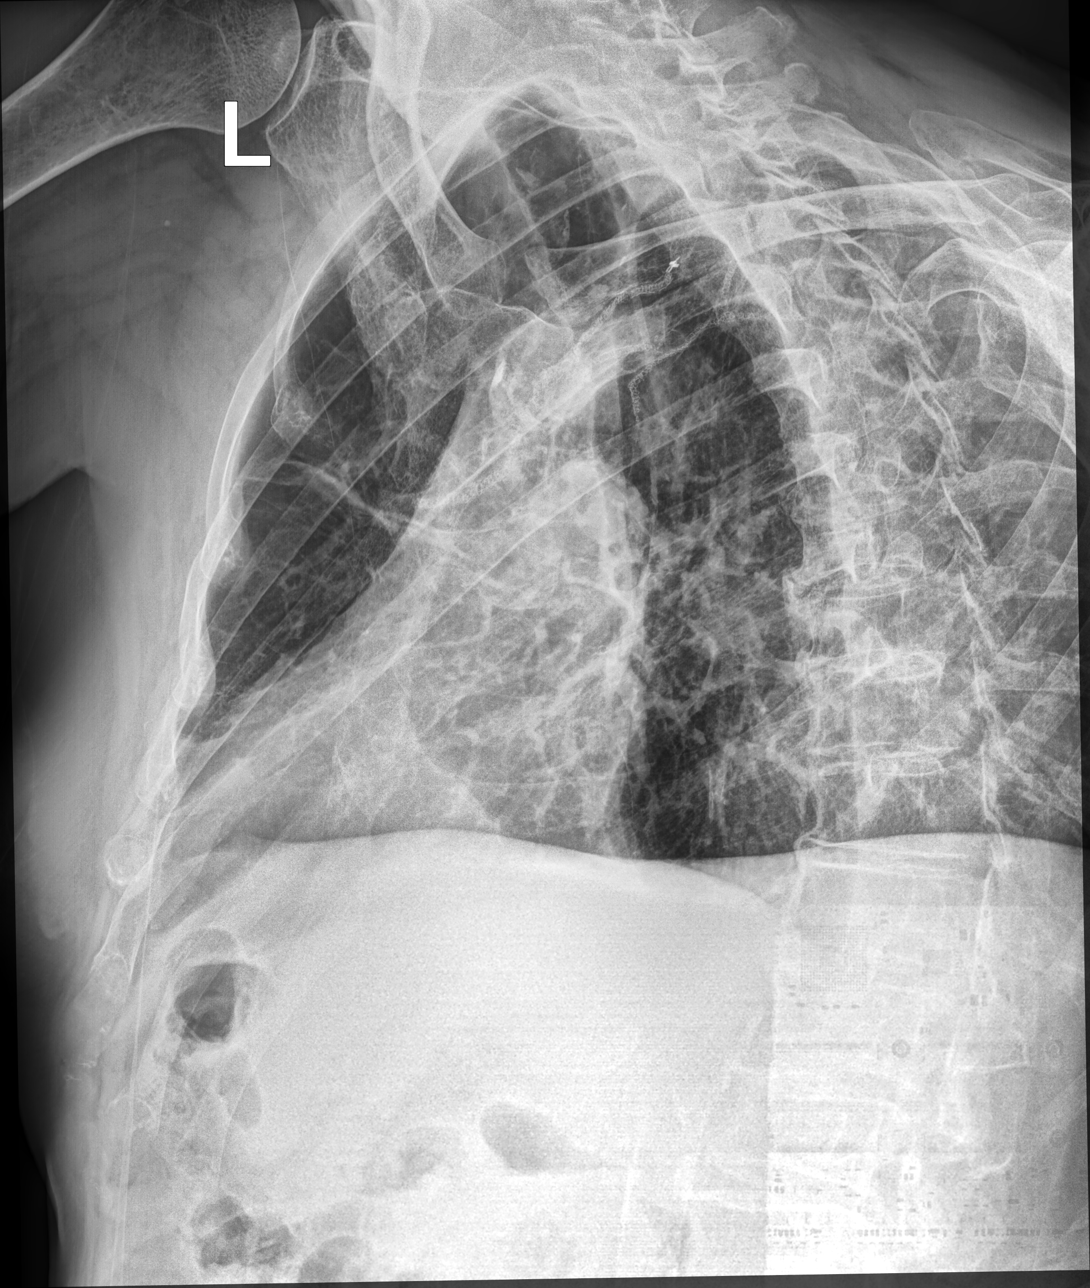

[3 of 3 positions shown; findings below may reference images not displayed]

FINDINGS: No fracture or other bone lesions are seen involving the ribs. There
is no evidence of pneumothorax or pleural effusion. Surgical staples
and scarring in the right lung apex and in the left midzone.
Complete clearing of the cavitary lesion in the left upper lobe
noted on the prior chest x-ray of 07/09/2019 and CT scan of
07/27/2019.

Heart size and mediastinal contours are within normal limits. Aortic
atherosclerosis.
IMPRESSION: 1. No visible rib fractures. No acute abnormalities.
2. Complete clearing of the cavitary lesion in the left upper lobe.
3.  Aortic Atherosclerosis ([id number]).

## 2020-11-27 DIAGNOSIS — Z961 Presence of intraocular lens: Secondary | ICD-10-CM | POA: Diagnosis not present

## 2020-12-05 DIAGNOSIS — K8050 Calculus of bile duct without cholangitis or cholecystitis without obstruction: Secondary | ICD-10-CM | POA: Diagnosis not present

## 2020-12-10 DIAGNOSIS — D692 Other nonthrombocytopenic purpura: Secondary | ICD-10-CM | POA: Diagnosis not present

## 2020-12-10 DIAGNOSIS — B351 Tinea unguium: Secondary | ICD-10-CM | POA: Diagnosis not present

## 2020-12-10 DIAGNOSIS — L814 Other melanin hyperpigmentation: Secondary | ICD-10-CM | POA: Diagnosis not present

## 2020-12-10 DIAGNOSIS — D485 Neoplasm of uncertain behavior of skin: Secondary | ICD-10-CM | POA: Diagnosis not present

## 2020-12-10 DIAGNOSIS — L570 Actinic keratosis: Secondary | ICD-10-CM | POA: Diagnosis not present

## 2020-12-10 DIAGNOSIS — D1801 Hemangioma of skin and subcutaneous tissue: Secondary | ICD-10-CM | POA: Diagnosis not present

## 2020-12-10 DIAGNOSIS — Z7189 Other specified counseling: Secondary | ICD-10-CM | POA: Diagnosis not present

## 2020-12-10 DIAGNOSIS — X32XXXA Exposure to sunlight, initial encounter: Secondary | ICD-10-CM | POA: Diagnosis not present

## 2020-12-10 DIAGNOSIS — L578 Other skin changes due to chronic exposure to nonionizing radiation: Secondary | ICD-10-CM | POA: Diagnosis not present

## 2020-12-10 DIAGNOSIS — C44319 Basal cell carcinoma of skin of other parts of face: Secondary | ICD-10-CM | POA: Diagnosis not present

## 2020-12-10 DIAGNOSIS — L821 Other seborrheic keratosis: Secondary | ICD-10-CM | POA: Diagnosis not present

## 2020-12-10 DIAGNOSIS — L853 Xerosis cutis: Secondary | ICD-10-CM | POA: Diagnosis not present

## 2020-12-23 DIAGNOSIS — I10 Essential (primary) hypertension: Secondary | ICD-10-CM | POA: Diagnosis not present

## 2020-12-23 DIAGNOSIS — I712 Thoracic aortic aneurysm, without rupture: Secondary | ICD-10-CM | POA: Diagnosis not present

## 2020-12-23 DIAGNOSIS — I6523 Occlusion and stenosis of bilateral carotid arteries: Secondary | ICD-10-CM | POA: Diagnosis not present

## 2020-12-23 DIAGNOSIS — I700 Atherosclerosis of aorta: Secondary | ICD-10-CM | POA: Diagnosis not present

## 2021-01-02 DIAGNOSIS — R197 Diarrhea, unspecified: Secondary | ICD-10-CM | POA: Diagnosis not present

## 2021-01-13 DIAGNOSIS — R197 Diarrhea, unspecified: Secondary | ICD-10-CM | POA: Diagnosis not present

## 2021-01-27 ENCOUNTER — Other Ambulatory Visit: Payer: Self-pay | Admitting: Family Medicine

## 2021-01-27 NOTE — Telephone Encounter (Signed)
Last Refill 12/02/2020 Last OV 10/07/2020 dx essential hypertension

## 2021-02-02 DIAGNOSIS — B351 Tinea unguium: Secondary | ICD-10-CM | POA: Diagnosis not present

## 2021-02-02 DIAGNOSIS — M2190 Unspecified acquired deformity of unspecified limb: Secondary | ICD-10-CM | POA: Diagnosis not present

## 2021-02-02 DIAGNOSIS — I70203 Unspecified atherosclerosis of native arteries of extremities, bilateral legs: Secondary | ICD-10-CM | POA: Diagnosis not present

## 2021-02-02 DIAGNOSIS — L84 Corns and callosities: Secondary | ICD-10-CM | POA: Diagnosis not present

## 2021-02-11 ENCOUNTER — Ambulatory Visit (INDEPENDENT_AMBULATORY_CARE_PROVIDER_SITE_OTHER): Payer: Medicare Other | Admitting: Family Medicine

## 2021-02-11 ENCOUNTER — Other Ambulatory Visit: Payer: Self-pay

## 2021-02-11 VITALS — BP 120/79 | HR 94 | Temp 98.0°F | Ht 71.0 in | Wt 197.2 lb

## 2021-02-11 DIAGNOSIS — F419 Anxiety disorder, unspecified: Secondary | ICD-10-CM

## 2021-02-11 DIAGNOSIS — K2100 Gastro-esophageal reflux disease with esophagitis, without bleeding: Secondary | ICD-10-CM | POA: Diagnosis not present

## 2021-02-11 DIAGNOSIS — C20 Malignant neoplasm of rectum: Secondary | ICD-10-CM | POA: Diagnosis not present

## 2021-02-11 DIAGNOSIS — N400 Enlarged prostate without lower urinary tract symptoms: Secondary | ICD-10-CM

## 2021-02-11 DIAGNOSIS — I10 Essential (primary) hypertension: Secondary | ICD-10-CM

## 2021-02-11 LAB — CBC
HCT: 44.6 % (ref 39.0–52.0)
Hemoglobin: 15.1 g/dL (ref 13.0–17.0)
MCHC: 33.9 g/dL (ref 30.0–36.0)
MCV: 97.2 fl (ref 78.0–100.0)
Platelets: 161 10*3/uL (ref 150.0–400.0)
RBC: 4.59 Mil/uL (ref 4.22–5.81)
RDW: 15.5 % (ref 11.5–15.5)
WBC: 5.3 10*3/uL (ref 4.0–10.5)

## 2021-02-11 LAB — COMPREHENSIVE METABOLIC PANEL
ALT: 106 U/L — ABNORMAL HIGH (ref 0–53)
AST: 93 U/L — ABNORMAL HIGH (ref 0–37)
Albumin: 4.3 g/dL (ref 3.5–5.2)
Alkaline Phosphatase: 73 U/L (ref 39–117)
BUN: 8 mg/dL (ref 6–23)
CO2: 28 mEq/L (ref 19–32)
Calcium: 9.7 mg/dL (ref 8.4–10.5)
Chloride: 96 mEq/L (ref 96–112)
Creatinine, Ser: 0.72 mg/dL (ref 0.40–1.50)
GFR: 90.19 mL/min (ref >60.00)
Glucose, Bld: 88 mg/dL (ref 70–99)
Potassium: 4.7 mEq/L (ref 3.5–5.1)
Sodium: 132 mEq/L — ABNORMAL LOW (ref 135–145)
Total Bilirubin: 0.7 mg/dL (ref 0.2–1.2)
Total Protein: 7 g/dL (ref 6.0–8.3)

## 2021-02-11 LAB — MAGNESIUM: Magnesium: 1.8 mg/dL (ref 1.5–2.5)

## 2021-02-11 LAB — TSH: TSH: 0.5 u[IU]/mL (ref 0.35–4.50)

## 2021-02-11 NOTE — Assessment & Plan Note (Signed)
At goal on losartan 50mg  daily. Orthostatic vitals normal.

## 2021-02-11 NOTE — Assessment & Plan Note (Signed)
Not controlled.  He is on Xanax 0.5 mg twice daily.  We will check labs today to make sure there is no other organic cause of anxiety.  If labs are normal would consider adding on SSRI.  Will place referral to psychiatry per patient request and request of his wife.

## 2021-02-11 NOTE — Assessment & Plan Note (Signed)
Will place referral to local GI.  No reported melena or hematochezia.

## 2021-02-11 NOTE — Assessment & Plan Note (Signed)
-  Continue Prilosec 20mg daily

## 2021-02-11 NOTE — Patient Instructions (Signed)
It was very nice to see you today!  We will check blood work today.  I will place a referral for you to see a gastroenterologist and urologist.  Depending on your blood work we may need to adjust your anxiety medications.  Take care, Dr [name]  PLEASE NOTE:  If you had any lab tests please let us know if you have not heard back within a few days. You may see your results on mychart before we have a chance to review them but we will give you a call once they are reviewed by [place]. If we ordered any referrals today, please let us know if you have not heard from their office within the next week.   Please try these tips to maintain a healthy lifestyle:   Eat at least 3 REAL meals and 1-2 snacks per day.  Aim for no more than 5 hours between eating.  If you eat breakfast, please do so within one hour of getting up.    Each meal should contain half fruits/vegetables, one quarter protein, and one quarter carbs (no bigger than a computer mouse)   Cut down on sweet beverages. This includes juice, soda, and sweet tea.     Drink at least 1 glass of water with each meal and aim for at least 8 glasses per day   Exercise at least 150 minutes every week.

## 2021-02-11 NOTE — Progress Notes (Signed)
   Jesus [name] is a 76 y.o. male who presents today for an office visit.  Assessment/Plan:  Chronic Problems Addressed Today: BPH (benign prostatic hyperplasia) s/p TURP 2021 He is on Flomax 0.4 mg daily.  Will place referral to urology in the area-has been following with urology in [place].  Anxiety Not controlled.  He is on Xanax 0.5 mg twice daily.  We will check labs today to make sure there is no other organic cause of anxiety.  If labs are normal would consider adding on SSRI.  Will place referral to psychiatry per patient request and request of his wife.  GERD (gastroesophageal reflux disease) Continue Prilosec 20 mg daily.  Essential hypertension At goal on losartan 50mg  daily. Orthostatic vitals normal.   Rectal cancer (HCC) Will place referral to local GI.  No reported melena or hematochezia.     Subjective:  HPI:  Patient here for follow-up.  He has felt more anxious over the last several weeks.  No obvious precipitating events.  He is on Xanax 0.5 mg twice daily which works well.  He has not taking Celexa.  He has not had any medication changes recently.  He has been under quite a bit of stress recently due to living arrangements.  He has been splitting time between here in [place].  His wife no longer wishes to go back and forth between [place] and wishes to stay here locally.  He would like to go to independent living or assisted living facility however his wife is not up for this yet.  He has been having some diarrhea that he thinks is due to having his gallbladder removed.  He has recently seen GI in [place] and also cardiologist with no concerns.       Objective:  Physical Exam: BP 120/79   Pulse 94   Temp 98 F (36.7 C)   Ht 5\' 11"  (1.803 m)   Wt 197 lb 3.2 oz (89.4 kg)   SpO2 97%   BMI 27.50 kg/m   Gen: No acute distress, resting comfortably CV: Regular rate and rhythm with no murmurs appreciated Pulm: Normal work of breathing, clear to  auscultation bilaterally with no crackles, wheezes, or rhonchi Neuro: Grossly normal, moves all extremities Psych: Normal affect and thought content      Jesus [name] M. Jesus Pain, MD 02/11/2021 11:36 AM

## 2021-02-11 NOTE — Assessment & Plan Note (Signed)
He is on Flomax 0.4 mg daily.  Will place referral to urology in the area-has been following with urology in [place].

## 2021-02-12 ENCOUNTER — Other Ambulatory Visit: Payer: Self-pay

## 2021-02-12 ENCOUNTER — Encounter: Payer: Self-pay | Admitting: Family Medicine

## 2021-02-12 DIAGNOSIS — F419 Anxiety disorder, unspecified: Secondary | ICD-10-CM

## 2021-02-12 DIAGNOSIS — R3914 Feeling of incomplete bladder emptying: Secondary | ICD-10-CM | POA: Diagnosis not present

## 2021-02-12 MED ORDER — CITALOPRAM HYDROBROMIDE 20 MG PO TABS: 20.0000 mg | ORAL_TABLET | Freq: Every day | ORAL | 0 refills | Status: DC

## 2021-02-12 NOTE — Telephone Encounter (Signed)
Labs reviewed by [facility].

## 2021-02-12 NOTE — Progress Notes (Signed)
Please inform patient of the following:  His sodium levels are down but stable compared to previous. His liver numbers are way up comapred to last year.  All of his other labs including thyroid and magnesium are normal.   I would like for him to come back in 1 week to recheck his liver numbers.  I think he is mostly having anxiety. We can restart celexa 20mg  daily if he wishes to try this. I will also place a referral for him to see a psychiatrist.  Jesus [name]. [name], MD 02/12/2021 3:17 PM

## 2021-02-19 ENCOUNTER — Other Ambulatory Visit (INDEPENDENT_AMBULATORY_CARE_PROVIDER_SITE_OTHER): Payer: Medicare Other

## 2021-02-19 ENCOUNTER — Encounter: Payer: Self-pay | Admitting: Family Medicine

## 2021-02-19 ENCOUNTER — Other Ambulatory Visit: Payer: Self-pay

## 2021-02-19 DIAGNOSIS — R748 Abnormal levels of other serum enzymes: Secondary | ICD-10-CM | POA: Diagnosis not present

## 2021-02-19 LAB — BASIC METABOLIC PANEL
BUN: 6 mg/dL (ref 6–23)
CO2: 30 mEq/L (ref 19–32)
Calcium: 10.2 mg/dL (ref 8.4–10.5)
Chloride: 96 mEq/L (ref 96–112)
Creatinine, Ser: 0.75 mg/dL (ref 0.40–1.50)
GFR: 89.07 mL/min (ref >60.00)
Glucose, Bld: 104 mg/dL — ABNORMAL HIGH (ref 70–99)
Potassium: 5.5 mEq/L — ABNORMAL HIGH (ref 3.5–5.1)
Sodium: 133 mEq/L — ABNORMAL LOW (ref 135–145)

## 2021-02-19 LAB — HEPATIC FUNCTION PANEL
ALT: 58 U/L — ABNORMAL HIGH (ref 0–53)
AST: 38 U/L — ABNORMAL HIGH (ref 0–37)
Albumin: 4.4 g/dL (ref 3.5–5.2)
Alkaline Phosphatase: 60 U/L (ref 39–117)
Bilirubin, Direct: 0.2 mg/dL (ref 0.0–0.3)
Total Bilirubin: 0.9 mg/dL (ref 0.2–1.2)
Total Protein: 7 g/dL (ref 6.0–8.3)

## 2021-02-20 ENCOUNTER — Other Ambulatory Visit: Payer: Self-pay | Admitting: *Deleted

## 2021-02-20 DIAGNOSIS — E875 Hyperkalemia: Secondary | ICD-10-CM

## 2021-02-20 NOTE — Progress Notes (Signed)
Please inform patient of the following:  Liver numbers are much better and almost back to normal but his potassium is now elevated. Can we have him come back one more time to recheck potassium? Please place order for BMET.  Algis Greenhouse. [name], MD 02/20/2021 2:40 PM

## 2021-02-24 ENCOUNTER — Other Ambulatory Visit (INDEPENDENT_AMBULATORY_CARE_PROVIDER_SITE_OTHER): Payer: Medicare Other

## 2021-02-24 ENCOUNTER — Other Ambulatory Visit: Payer: Self-pay

## 2021-02-24 DIAGNOSIS — E875 Hyperkalemia: Secondary | ICD-10-CM | POA: Diagnosis not present

## 2021-02-24 LAB — BASIC METABOLIC PANEL
BUN: 6 mg/dL (ref 6–23)
CO2: 31 mEq/L (ref 19–32)
Calcium: 9.9 mg/dL (ref 8.4–10.5)
Chloride: 98 mEq/L (ref 96–112)
Creatinine, Ser: 0.66 mg/dL (ref 0.40–1.50)
GFR: 92.56 mL/min (ref >60.00)
Glucose, Bld: 104 mg/dL — ABNORMAL HIGH (ref 70–99)
Potassium: 4.8 mEq/L (ref 3.5–5.1)
Sodium: 136 mEq/L (ref 135–145)

## 2021-02-24 NOTE — Progress Notes (Signed)
Please inform patient of the following:  Sodium and potassium levels are back to normal. Do not need to do any further testing at this point.  Jesus [name]. [name], MD 02/24/2021 11:39 AM

## 2021-02-26 ENCOUNTER — Ambulatory Visit (INDEPENDENT_AMBULATORY_CARE_PROVIDER_SITE_OTHER): Payer: Medicare Other | Admitting: Behavioral Health

## 2021-02-26 ENCOUNTER — Encounter: Payer: Self-pay | Admitting: Behavioral Health

## 2021-02-26 ENCOUNTER — Other Ambulatory Visit: Payer: Self-pay

## 2021-02-26 VITALS — BP 144/79 | HR 63 | Ht 70.0 in | Wt 202.0 lb

## 2021-02-26 DIAGNOSIS — F411 Generalized anxiety disorder: Secondary | ICD-10-CM | POA: Diagnosis not present

## 2021-02-26 DIAGNOSIS — F3289 Other specified depressive episodes: Secondary | ICD-10-CM

## 2021-02-26 NOTE — Progress Notes (Signed)
[facility] MD/PA/NP Initial Note  02/26/2021 3:48 PM Jesus [name]  MRN:  [id number]  Chief Complaint:  Chief Complaint    Anxiety; Depression      HPI:  75 year old male presents to this office with spouse present with consent for initial visit to establish care. He says, "I have never been to see shrink before but my wife wanted me to come and deal more with my anxiety, and nerve problems". Patient says that he has struggled with nerve problems since the age of 29. Says he has always taken medication such as valium, ativan, or xanax. He says, "Im just a high energy nervous person". He says that he worries a lot, especially about his health and living. Says that he has had cancer 4 times and been through many health issues including radiation and chemo.Says that he has always drank a lot, but recently has been dry for 3 weeks. Says the longest he has gone has been 6 months. Spouse disagrees with just anxiety problems. She says she is seeing greater short term memory loss as well as depression. She says some memory improvement since he stopped drinking. He reports is anxiety today at 6 and depression 0. Says that he sleeps 6-7 hours per night. He does report racing thoughts, irritability, and trouble with concentration. He says that Dr. [name] recently prescribed him Celexa 20 mg daily to try but he has inconsistently taken the drug for one week. Pt and spouse agreed that he does not take his medication consisently. Pt says that he now takes breaks a 0.5 mg xanax if half and takes .25 mg tab 3 times per day, sometimes less. He says he never takes more than that. He says that he does not want to wean off xanax at this time, especially since he just stopped drinking. He agreed to start taking the Celexa everyday and follow up in 4 weeks to reevaluate. Denies self harm, SI/HI.  Past Psychiatric Medication: Valium Ativan    Visit Diagnosis:    ICD-10-CM   1. Generalized anxiety disorder  F41.1    2. Other depression  F32.89     Past Psychiatric History:   Past Medical History:  Past Medical History:  Diagnosis Date  . Anxiety   . Arthritis   . Cancer ([place])   . Colon cancer ([place])    2000  . Colon polyp   . Depression   . GERD (gastroesophageal reflux disease)   . Hyperlipidemia   . Hypertension   . Lung cancer ([place])    2010    Past Surgical History:  Procedure Laterality Date  . ANKLE FUSION     crushed ankle while in high school  . APPENDECTOMY     1960's  . CHOLECYSTECTOMY    . COLON SURGERY    . PROSTATE SURGERY      Family Psychiatric History:   Family History:  Family History  Problem Relation Age of Onset  . Heart disease Father   . Stroke Brother   . Breast cancer Sister   . Breast cancer Daughter   . Prostate cancer Neg Hx     Social History:  Social History   Socioeconomic History  . Marital status: Married    Spouse name: Not on file  . Number of children: 1  . Years of education: Not on file  . Highest education level: Some college, no degree  Occupational History  . Occupation: Retired   Tobacco Use  . Smoking status: Former  Smoker    Quit date: 2010    Years since quitting: 12.3  . Smokeless tobacco: Never Used  Substance and Sexual Activity  . Alcohol use: Not Currently  . Drug use: Never  . Sexual activity: Not Currently  Other Topics Concern  . Not on file  Social History Narrative   Lives part of the time in [place] and is followed by the [facility] there   Social Determinants of Health   Financial Resource Strain: Not on file  Food Insecurity: Not on file  Transportation Needs: Not on file  Physical Activity: Not on file  Stress: Not on file  Social Connections: Not on file    Allergies: No Known Allergies  Metabolic Disorder Labs: No results found for: HGBA1C, MPG No results found for: PROLACTIN Lab Results  Component Value Date   CHOL 109 06/29/2019   TRIG 67.0 06/29/2019   HDL 46.70  06/29/2019   CHOLHDL 2 06/29/2019   VLDL 13.4 06/29/2019   LDLCALC 49 06/29/2019   Lab Results  Component Value Date   TSH 0.50 02/11/2021   TSH 0.81 07/30/2019    Therapeutic Level Labs: No results found for: LITHIUM No results found for: VALPROATE No components found for:  CBMZ  Current Medications: Current Outpatient Medications  Medication Sig Dispense Refill  . ALPRAZolam (XANAX) 0.5 MG tablet TAKE 1 TABLET BY MOUTH TWICE DAILY AS NEEDED 60 tablet 5  . atorvastatin (LIPITOR) 20 MG tablet Take 1 tablet (20 mg total) by mouth daily. 90 tablet 3  . B Complex Vitamins (B COMPLEX PO) Take by mouth.    . citalopram (CELEXA) 20 MG tablet Take 1 tablet (20 mg total) by mouth daily. Take by mouth. 30 tablet 0  . losartan (COZAAR) 50 MG tablet Take 25mg  (half tablet) dail. 90 tablet 1  . Multiple Vitamin (MULTIVITAMIN PO) Take by mouth.    . Multiple Vitamins-Minerals (ZINC PO) Take by mouth.    Marland Kitchen omeprazole (PRILOSEC) 20 MG capsule TAKE 1 CAPSULE(20 MG) BY MOUTH DAILY 90 capsule 0  . tamsulosin (FLOMAX) 0.4 MG CAPS capsule TAKE 1 CAPSULE BY MOUTH EVERY DAY 1/2 HOUR AFTER SAME MEAL    . VITAMIN D PO Take 800 Units by mouth.     . potassium chloride SA (KLOR-CON) 20 MEQ tablet Take 1 tablet (20 mEq total) by mouth daily. (Patient not taking: Reported on 02/26/2021) 30 tablet 3   No current facility-administered medications for this visit.    Medication Side Effects: none  Orders placed this visit:  No orders of the defined types were placed in this encounter.   Psychiatric Specialty Exam:  Review of Systems  Constitutional: Negative.   Genitourinary: Positive for difficulty urinating.  Musculoskeletal: Positive for gait problem.  Psychiatric/Behavioral: The patient is nervous/anxious.     Blood pressure (!) 144/79, pulse 63, height 5\' 10"  (1.778 m), weight 202 lb (91.6 kg).Body mass index is 28.98 kg/m.  General Appearance: Neat and Well Groomed  Eye Contact:  Good  Speech:   Clear and Coherent  Volume:  Normal  Mood:  Anxious  Affect:  Appropriate  Thought Process:  Coherent  Orientation:  Full (Time, Place, and Person)  Thought Content: Logical   Suicidal Thoughts:  No  Homicidal Thoughts:  No  Memory:  WNL  Judgement:  Fair  Insight:  Fair  Psychomotor Activity:  Normal  Concentration:  Concentration: Fair  Recall:  [employer] of Knowledge: Fair  Language: Good  Assets:  Desire for Improvement Physical Health  ADL's:  Intact  Cognition: WNL  Prognosis:  Fair   Screenings:  GAD-7   Flowsheet Row Office Visit from 02/26/2021 in [facility]  Total GAD-7 Score 19    PHQ2-9   [facility] Office Visit from 02/26/2021 in [facility] from 01/24/2020 in [facility] Visit from 06/29/2019 in [facility]  PHQ-2 Total Score 0 0 0      Receiving Psychotherapy: No    Treatment Plan/Recommendations:  To continue current medication regimen. Will continue taking Celexa 20 mg. Will report and side effects or worsening symptoms. To follow up in 4 weeks to reassess. Greater than 50% of face to face time with patient was spent on counseling and coordination of care. We discussed alcohol abuse and resources. Talked about long term effects of xanax and mixing alcohol with medication. Discussed taking medications as prescribed and utilizing medication organizer. Reinforced SSRI's take 4-6 weeks to work.    Elwanda [place], NP

## 2021-03-04 ENCOUNTER — Telehealth: Payer: Self-pay

## 2021-03-04 NOTE — Telephone Encounter (Signed)
Pt is requesting a referral be placed for physical therapy and sent to [facility] in [place]. Their number is [phone]. Please advise.

## 2021-03-04 NOTE — Telephone Encounter (Signed)
Ok to place referral.

## 2021-03-05 ENCOUNTER — Other Ambulatory Visit: Payer: Self-pay | Admitting: *Deleted

## 2021-03-05 DIAGNOSIS — M25559 Pain in unspecified hip: Secondary | ICD-10-CM

## 2021-03-05 NOTE — Telephone Encounter (Signed)
Referral placed. Patient aware.  

## 2021-03-05 NOTE — Telephone Encounter (Signed)
Ok with me. Please place any necessary orders. 

## 2021-03-10 ENCOUNTER — Other Ambulatory Visit: Payer: Self-pay | Admitting: Family Medicine

## 2021-03-11 DIAGNOSIS — M792 Neuralgia and neuritis, unspecified: Secondary | ICD-10-CM | POA: Diagnosis not present

## 2021-03-14 ENCOUNTER — Other Ambulatory Visit: Payer: Self-pay | Admitting: Family Medicine

## 2021-03-17 DIAGNOSIS — R3914 Feeling of incomplete bladder emptying: Secondary | ICD-10-CM | POA: Diagnosis not present

## 2021-03-25 DIAGNOSIS — M25551 Pain in right hip: Secondary | ICD-10-CM | POA: Diagnosis not present

## 2021-03-25 DIAGNOSIS — R269 Unspecified abnormalities of gait and mobility: Secondary | ICD-10-CM | POA: Diagnosis not present

## 2021-03-25 DIAGNOSIS — M79672 Pain in left foot: Secondary | ICD-10-CM | POA: Diagnosis not present

## 2021-03-25 DIAGNOSIS — M79671 Pain in right foot: Secondary | ICD-10-CM | POA: Diagnosis not present

## 2021-03-26 ENCOUNTER — Other Ambulatory Visit: Payer: Self-pay

## 2021-03-26 ENCOUNTER — Encounter: Payer: Self-pay | Admitting: Behavioral Health

## 2021-03-26 ENCOUNTER — Ambulatory Visit (INDEPENDENT_AMBULATORY_CARE_PROVIDER_SITE_OTHER): Payer: Medicare Other

## 2021-03-26 ENCOUNTER — Ambulatory Visit (INDEPENDENT_AMBULATORY_CARE_PROVIDER_SITE_OTHER): Payer: Medicare Other | Admitting: Behavioral Health

## 2021-03-26 VITALS — BP 118/62 | HR 80 | Temp 98.2°F | Wt 201.2 lb

## 2021-03-26 DIAGNOSIS — Z0000 Encounter for general adult medical examination without abnormal findings: Secondary | ICD-10-CM | POA: Diagnosis not present

## 2021-03-26 DIAGNOSIS — F3289 Other specified depressive episodes: Secondary | ICD-10-CM | POA: Diagnosis not present

## 2021-03-26 DIAGNOSIS — F411 Generalized anxiety disorder: Secondary | ICD-10-CM | POA: Diagnosis not present

## 2021-03-26 NOTE — Patient Instructions (Addendum)
Mr. Jesus [name] , Thank you for taking time to come for your Medicare Wellness Visit. I appreciate your ongoing commitment to your health goals. Please review the following plan we discussed and let me know if I can assist you in the future.   Screening recommendations/referrals: Colonoscopy: Order placed previously and spoke with pt waiting for orders from [facility]  Recommended yearly ophthalmology/optometry visit for glaucoma screening and checkup Recommended yearly dental visit for hygiene and checkup  Vaccinations: Influenza vaccine: Up to date Pneumococcal vaccine: Due and discussed Tdap vaccine: Due and discussed Shingles vaccine: Shingrix discussed. Please contact your pharmacy for coverage information.    Covid-19: Completed 2/5, 12/25/19  Advanced directives: Please bring a copy of your health care power of attorney and living will to the office at your convenience.  Conditions/risks identified: Work with PT  Next appointment: Follow up in one year for your annual wellness visit.   Preventive Care 74 Years and Older, Male Preventive care refers to lifestyle choices and visits with your health care provider that can promote health and wellness. What does preventive care include?  A yearly physical exam. This is also called an annual well check.  Dental exams once or twice a year.  Routine eye exams. Ask your health care provider how often you should have your eyes checked.  Personal lifestyle choices, including:  Daily care of your teeth and gums.  Regular physical activity.  Eating a healthy diet.  Avoiding tobacco and drug use.  Limiting alcohol use.  Practicing safe sex.  Taking low doses of aspirin every day.  Taking vitamin and mineral supplements as recommended by your health care provider. What happens during an annual well check? The services and screenings done by your health care provider during your annual well check will depend on your age, overall  health, lifestyle risk factors, and family history of disease. Counseling  Your health care provider may ask you questions about your:  Alcohol use.  Tobacco use.  Drug use.  Emotional well-being.  Home and relationship well-being.  Sexual activity.  Eating habits.  History of falls.  Memory and ability to understand (cognition).  Work and work Statistician. Screening  You may have the following tests or measurements:  Height, weight, and BMI.  Blood pressure.  Lipid and cholesterol levels. These may be checked every 5 years, or more frequently if you are over 74 years old.  Skin check.  Lung cancer screening. You may have this screening every year starting at age 74 if you have a 30-pack-year history of smoking and currently smoke or have quit within the past 15 years.  Fecal occult blood test (FOBT) of the stool. You may have this test every year starting at age 74.  Flexible sigmoidoscopy or colonoscopy. You may have a sigmoidoscopy every 5 years or a colonoscopy every 10 years starting at age 74.  Prostate cancer screening. Recommendations will vary depending on your family history and other risks.  Hepatitis C blood test.  Hepatitis B blood test.  Sexually transmitted disease (STD) testing.  Diabetes screening. This is done by checking your blood sugar (glucose) after you have not eaten for a while (fasting). You may have this done every 1-3 years.  Abdominal aortic aneurysm (AAA) screening. You may need this if you are a current or former smoker.  Osteoporosis. You may be screened starting at age 74 if you are at high risk. Talk with your health care provider about your test results, treatment options, and if  necessary, the need for more tests. Vaccines  Your health care provider may recommend certain vaccines, such as:  Influenza vaccine. This is recommended every year.  Tetanus, diphtheria, and acellular pertussis (Tdap, Td) vaccine. You may need a Td  booster every 10 years.  Zoster vaccine. You may need this after age 74.  Pneumococcal 13-valent conjugate (PCV13) vaccine. One dose is recommended after age 74.  Pneumococcal polysaccharide (PPSV23) vaccine. One dose is recommended after age 74. Talk to your health care provider about which screenings and vaccines you need and how often you need them. This information is not intended to replace advice given to you by your health care provider. Make sure you discuss any questions you have with your health care provider. Document Released: 11/07/2015 Document Revised: 06/30/2016 Document Reviewed: 08/12/2015 Elsevier Interactive Patient Education  2017 [place] Prevention in the Home Falls can cause injuries. They can happen to people of all ages. There are many things you can do to make your home safe and to help prevent falls. What can I do on the outside of my home?  Regularly fix the edges of walkways and driveways and fix any cracks.  Remove anything that might make you trip as you walk through a door, such as a raised step or threshold.  Trim any bushes or trees on the path to your home.  Use bright outdoor lighting.  Clear any walking paths of anything that might make someone trip, such as rocks or tools.  Regularly check to see if handrails are loose or broken. Make sure that both sides of any steps have handrails.  Any raised decks and porches should have guardrails on the edges.  Have any leaves, snow, or ice cleared regularly.  Use sand or salt on walking paths during winter.  Clean up any spills in your garage right away. This includes oil or grease spills. What can I do in the bathroom?  Use night lights.  Install grab bars by the toilet and in the tub and shower. Do not use towel bars as grab bars.  Use non-skid mats or decals in the tub or shower.  If you need to sit down in the shower, use a plastic, non-slip stool.  Keep the floor dry. Clean up  any water that spills on the floor as soon as it happens.  Remove soap buildup in the tub or shower regularly.  Attach bath mats securely with double-sided non-slip rug tape.  Do not have throw rugs and other things on the floor that can make you trip. What can I do in the bedroom?  Use night lights.  Make sure that you have a light by your bed that is easy to reach.  Do not use any sheets or blankets that are too big for your bed. They should not hang down onto the floor.  Have a firm chair that has side arms. You can use this for support while you get dressed.  Do not have throw rugs and other things on the floor that can make you trip. What can I do in the kitchen?  Clean up any spills right away.  Avoid walking on wet floors.  Keep items that you use a lot in easy-to-reach places.  If you need to reach something above you, use a strong step stool that has a grab bar.  Keep electrical cords out of the way.  Do not use floor polish or wax that makes floors slippery. If you must  use wax, use non-skid floor wax.  Do not have throw rugs and other things on the floor that can make you trip. What can I do with my stairs?  Do not leave any items on the stairs.  Make sure that there are handrails on both sides of the stairs and use them. Fix handrails that are broken or loose. Make sure that handrails are as long as the stairways.  Check any carpeting to make sure that it is firmly attached to the stairs. Fix any carpet that is loose or worn.  Avoid having throw rugs at the top or bottom of the stairs. If you do have throw rugs, attach them to the floor with carpet tape.  Make sure that you have a light switch at the top of the stairs and the bottom of the stairs. If you do not have them, ask someone to add them for you. What else can I do to help prevent falls?  Wear shoes that:  Do not have high heels.  Have rubber bottoms.  Are comfortable and fit you well.  Are  closed at the toe. Do not wear sandals.  If you use a stepladder:  Make sure that it is fully opened. Do not climb a closed stepladder.  Make sure that both sides of the stepladder are locked into place.  Ask someone to hold it for you, if possible.  Clearly mark and make sure that you can see:  Any grab bars or handrails.  First and last steps.  Where the edge of each step is.  Use tools that help you move around (mobility aids) if they are needed. These include:  Canes.  Walkers.  Scooters.  Crutches.  Turn on the lights when you go into a dark area. Replace any light bulbs as soon as they burn out.  Set up your furniture so you have a clear path. Avoid moving your furniture around.  If any of your floors are uneven, fix them.  If there are any pets around you, be aware of where they are.  Review your medicines with your doctor. Some medicines can make you feel dizzy. This can increase your chance of falling. Ask your doctor what other things that you can do to help prevent falls. This information is not intended to replace advice given to you by your health care provider. Make sure you discuss any questions you have with your health care provider. Document Released: 08/07/2009 Document Revised: 03/18/2016 Document Reviewed: 11/15/2014 Elsevier Interactive Patient Education  2017 [employer].

## 2021-03-26 NOTE — Progress Notes (Signed)
[facility]  Patient ID: Jesus [name],  MRN: [id number]  PCP: Vivi Barrack, MD  Date of Evaluation: 03/26/2021 Time spent:10 minutes  Chief Complaint:   HISTORY/CURRENT STATUS: HPI  75 year old patient presents to this office for follow up and medication management. He said that, "I feel great. I have had no issues. My wife likes me better now too that I have stopped drinking".  Says he believes that Celexa 20 mg is helping anxiety and depression. Reports anxiety at 2 and depression 0. He says that he is sleeping approximately 7 hours per night. He does not want to make any changes to his medications at this time. Agrees to three month follow up. Reports no mania. No psychosis, no SI/HI  Individual Medical History/ Review of Systems: Changes? :No   Allergies: Patient has no known allergies.  Current Medications:  Current Outpatient Medications:  .  ALPRAZolam (XANAX) 0.5 MG tablet, TAKE 1 TABLET BY MOUTH TWICE DAILY AS NEEDED, Disp: 60 tablet, Rfl: 5 .  atorvastatin (LIPITOR) 20 MG tablet, Take 1 tablet (20 mg total) by mouth daily., Disp: 90 tablet, Rfl: 3 .  B Complex Vitamins (B COMPLEX PO), Take by mouth., Disp: , Rfl:  .  citalopram (CELEXA) 20 MG tablet, TAKE 1 TABLET(20 MG) BY MOUTH DAILY, Disp: 30 tablet, Rfl: 2 .  hydrochlorothiazide (HYDRODIURIL) 25 MG tablet, TAKE 1 TABLET(25 MG) BY MOUTH DAILY, Disp: 90 tablet, Rfl: 1 .  losartan (COZAAR) 50 MG tablet, Take 25mg  (half tablet) dail., Disp: 90 tablet, Rfl: 1 .  Multiple Vitamin (MULTIVITAMIN PO), Take by mouth., Disp: , Rfl:  .  Multiple Vitamins-Minerals (ZINC PO), Take by mouth., Disp: , Rfl:  .  omeprazole (PRILOSEC) 20 MG capsule, TAKE 1 CAPSULE(20 MG) BY MOUTH DAILY, Disp: 90 capsule, Rfl: 0 .  potassium chloride SA (KLOR-CON) 20 MEQ tablet, Take 1 tablet (20 mEq total) by mouth daily. (Patient not taking: Reported on 02/26/2021), Disp: 30 tablet, Rfl: 3 .  tamsulosin (FLOMAX) 0.4 MG CAPS capsule,  TAKE 1 CAPSULE BY MOUTH EVERY DAY 1/2 HOUR AFTER SAME MEAL, Disp: , Rfl:  .  VITAMIN D PO, Take 800 Units by mouth. , Disp: , Rfl:  Medication Side Effects: none  Family Medical/ Social History: Changes?no  MENTAL HEALTH EXAM:  There were no vitals taken for this visit.There is no height or weight on file to calculate BMI.  General Appearance: Casual, Neat and Well Groomed  Eye Contact:  Good  Speech:  Normal Rate  Volume:  Normal  Mood:  NA  Affect:  Appropriate  Thought Process:  Coherent  Orientation:  Full (Time, Place, and Person)  Thought Content: Logical   Suicidal Thoughts:  No  Homicidal Thoughts:  No  Memory:  WNL  Judgement:  Good  Insight:  Good  Psychomotor Activity:  Normal  Concentration:  Concentration: Good  Recall:  Good  Fund of Knowledge: Good  Language: Good  Assets:  Desire for Improvement  ADL's:  Intact  Cognition: WNL  Prognosis:  Good    DIAGNOSES:    ICD-10-CM   1. Generalized anxiety disorder  F41.1   2. Other depression  F32.89     Receiving Psychotherapy: No    RECOMMENDATIONS:  To continue current medication regimen. Will continue taking Celexa 20 mg. Will report and side effects or worsening symptoms. To follow up in 3 months to reassess. Greater than 50% 10 min. of face to face time with patient was spent on counseling  and coordination of care. We discussed his continued progress with ETOH. Patient says he has not had any alcohol now in 3 months. Discussed taking medications as prescribed and utilizing medication organizer. Pt expresses that he feels good and medication is working on his anxiety and depression.  Elwanda Brooklyn, NP

## 2021-03-26 NOTE — Progress Notes (Signed)
Subjective:   Aric Jost is a 75 y.o. male who presents for Medicare Annual/Subsequent preventive examination.  Review of Systems     Cardiac Risk Factors include: advanced age (>38men, >76 women);hypertension;dyslipidemia;male gender     Objective:    Today's Vitals   03/26/21 1506  BP: 118/62  Pulse: 80  Temp: 98.2 F (36.8 C)  SpO2: 92%  Weight: 201 lb 3.2 oz (91.3 kg)   Body mass index is 28.87 kg/m.  Advanced Directives 03/26/2021 01/24/2020  Does Patient Have a Medical Advance Directive? Yes Yes  Type of Advance Directive Living will Living will;Healthcare Power of Attorney  Does patient want to make changes to medical advance directive? - No - Patient declined  Copy of [place] in Chart? No - copy requested No - copy requested    Current Medications (verified) Outpatient Encounter Medications as of 03/26/2021  Medication Sig  . alfuzosin (UROXATRAL) 10 MG 24 hr tablet Take 10 mg by mouth daily.  Marland Kitchen ALPRAZolam (XANAX) 0.5 MG tablet TAKE 1 TABLET BY MOUTH TWICE DAILY AS NEEDED  . atorvastatin (LIPITOR) 20 MG tablet Take 1 tablet (20 mg total) by mouth daily.  . B Complex Vitamins (B COMPLEX PO) Take by mouth.  . citalopram (CELEXA) 20 MG tablet TAKE 1 TABLET(20 MG) BY MOUTH DAILY  . folic acid (FOLVITE) 1 MG tablet Take by mouth.  . hydrochlorothiazide (HYDRODIURIL) 25 MG tablet TAKE 1 TABLET(25 MG) BY MOUTH DAILY  . ibuprofen (ADVIL) 200 MG tablet Take by mouth.  . losartan (COZAAR) 50 MG tablet Take 25mg  (half tablet) dail.  . Multiple Vitamin (MULTIVITAMIN PO) Take by mouth.  . Multiple Vitamins-Minerals (ZINC PO) Take by mouth.  Marland Kitchen omeprazole (PRILOSEC) 20 MG capsule TAKE 1 CAPSULE(20 MG) BY MOUTH DAILY  . VITAMIN D PO Take 800 Units by mouth.   . potassium chloride SA (KLOR-CON) 20 MEQ tablet Take 1 tablet (20 mEq total) by mouth daily. (Patient not taking: No sig reported)  . [DISCONTINUED] potassium chloride (KLOR-CON) 20 MEQ packet  Take by mouth 2 (two) times daily.  . [DISCONTINUED] tamsulosin (FLOMAX) 0.4 MG CAPS capsule TAKE 1 CAPSULE BY MOUTH EVERY DAY 1/2 HOUR AFTER SAME MEAL (Patient not taking: Reported on 03/26/2021)   No facility-administered encounter medications on file as of 03/26/2021.    Allergies (verified) Patient has no known allergies.   History: Past Medical History:  Diagnosis Date  . Anxiety   . Arthritis   . Cancer ([place])   . Colon cancer ([place])    2000  . Colon polyp   . Depression   . GERD (gastroesophageal reflux disease)   . Hyperlipidemia   . Hypertension   . Lung cancer ([place])    2010   Past Surgical History:  Procedure Laterality Date  . ANKLE FUSION     crushed ankle while in high school  . APPENDECTOMY     1960's  . CHOLECYSTECTOMY    . COLON SURGERY    . PROSTATE SURGERY     Family History  Problem Relation Age of Onset  . Heart disease Father   . Stroke Brother   . Breast cancer Sister   . Breast cancer Daughter   . Prostate cancer Neg Hx    Social History   Socioeconomic History  . Marital status: Married    Spouse name: Not on file  . Number of children: 1  . Years of education: Not on file  . Highest education level: Some  college, no degree  Occupational History  . Occupation: Retired   Tobacco Use  . Smoking status: Former Smoker    Quit date: 2010    Years since quitting: 12.4  . Smokeless tobacco: Never Used  Substance and Sexual Activity  . Alcohol use: Not Currently    Comment: 3 months without a drink  . Drug use: Never  . Sexual activity: Not Currently  Other Topics Concern  . Not on file  Social History Narrative    Retired. Lives part of the time in [place] and is followed by the [facility] there. Lives with spouse. Like to play golf and stay active. Playing golf twice a week. Has begun to swim everyday. No ETOH in 3 months. Relationship with spouse has improved.   Social Determinants of Health   Financial Resource Strain:  Low Risk   . Difficulty of Paying Living Expenses: Not hard at all  Food Insecurity: No Food Insecurity  . Worried About Charity fundraiser in the Last Year: Never true  . Ran Out of Food in the Last Year: Never true  Transportation Needs: No Transportation Needs  . Lack of Transportation (Medical): No  . Lack of Transportation (Non-Medical): No  Physical Activity: Sufficiently Active  . Days of Exercise per Week: 3 days  . Minutes of Exercise per Session: 60 min  Stress: No Stress Concern Present  . Feeling of Stress : Not at all  Social Connections: Moderately Isolated  . Frequency of Communication with Friends and Family: More than three times a week  . Frequency of Social Gatherings with Friends and Family: More than three times a week  . Attends Religious Services: Never  . Active Member of Clubs or Organizations: No  . Attends Archivist Meetings: Never  . Marital Status: Married    Tobacco Counseling Counseling given: Not Answered   Clinical Intake:  Pre-visit preparation completed: Yes  Pain : No/denies pain     BMI - recorded: 28.87 Nutritional Status: BMI 25 -29 Overweight Nutritional Risks: Nausea/ vomitting/ diarrhea (loose stool always related to surgery history of the colon) Diabetes: No  How often do you need to have someone help you when you read instructions, pamphlets, or other written materials from your doctor or pharmacy?: 1 - Never  Diabetic?No  Interpreter Needed?: No  Information entered by :: Charlott Rakes, LPN   Activities of Daily Living In your present state of health, do you have any difficulty performing the following activities: 03/26/2021  Hearing? N  Vision? N  Difficulty concentrating or making decisions? Y  Comment at times forget  Walking or climbing stairs? N  Dressing or bathing? N  Doing errands, shopping? N  Preparing Food and eating ? N  Using the Toilet? N  In the past six months, have you accidently leaked  urine? N  Do you have problems with loss of bowel control? N  Managing your Medications? N  Managing your Finances? N  Housekeeping or managing your Housekeeping? N  Some recent data might be hidden    Patient Care Team: Vivi Barrack, MD as PCP - General (Family Medicine) Laurin Coder, MD as Consulting Physician (Pulmonary Disease) [place], Jennefer Bravo, MD as Consulting Physician (Dermatology) Belva Crome, MD as Consulting Physician (Cardiology)  Indicate any recent Medical Services you may have received from other than [facility] providers in the past year (date may be approximate).     Assessment:   This is a routine  wellness examination for Gwyndolyn Saxon.  Hearing/Vision screen  Hearing Screening   125Hz  250Hz  500Hz  1000Hz  2000Hz  3000Hz  4000Hz  6000Hz  8000Hz   Right ear:           Left ear:           Comments: Pt denies any hearing issues   Vision Screening Comments: Pt follows up with [facility] in [place]   Dietary issues and exercise activities discussed: Current Exercise Habits: Structured exercise class;Home exercise routine, Type of exercise: walking;Other - see comments (golfing AND working with PT), Time (Minutes): > 60, Frequency (Times/Week): 3, Weekly Exercise (Minutes/Week): 0  Goals Addressed            This Visit's Progress   . Patient Stated       Working with PT       Depression Screen PHQ 2/9 Scores 03/26/2021 01/24/2020 06/29/2019  PHQ - 2 Score 0 0 0  Some encounter information is confidential and restricted. Go to Review Flowsheets activity to see all data.    Fall Risk Fall Risk  03/26/2021 01/24/2020 06/29/2019  Falls in the past year? 0 1 0  Number falls in past yr: 0 1 -  Injury with Fall? 0 0 -  Risk for fall due to : Impaired balance/gait;Impaired vision;Impaired mobility History of fall(s) -  Risk for fall due to: Comment working with PT - -  Follow up Falls prevention discussed Falls evaluation completed;Education provided;Falls  prevention discussed -    FALL RISK PREVENTION PERTAINING TO THE HOME:  Any stairs in or around the home? Yes  If so, are there any without handrails? No  Home free of loose throw rugs in walkways, pet beds, electrical cords, etc? Yes  Adequate lighting in your home to reduce risk of falls? Yes   ASSISTIVE DEVICES UTILIZED TO PREVENT FALLS:  Life alert? No   Apple watch  Use of a cane, walker or w/c? No  Grab bars in the bathroom? Yes  Shower chair or bench in shower? No  Elevated toilet seat or a handicapped toilet? Yes   TIMED UP AND GO:  Was the test performed? Yes .  Length of time to ambulate 10 feet: 10 sec.   Gait slow and steady without use of assistive device  Cognitive Function:     6CIT Screen 03/26/2021 01/24/2020  What Year? 0 points 0 points  What month? 0 points 0 points  What time? 0 points 0 points  Count back from 20 0 points 0 points  Months in reverse 0 points 0 points  Repeat phrase 0 points 0 points  Total Score 0 0    Immunizations Immunization History  Administered Date(s) Administered  . Fluad Quad(high Dose 65+) 07/30/2019, 08/01/2020  . Influenza, High Dose Seasonal PF 07/30/2019, 08/01/2020  . Influenza,inj,Quad PF,6+ Mos 11/08/2011  . Influenza-Unspecified 10/14/2012, 08/05/2013, 08/16/2017  . PFIZER(Purple Top)SARS-COV-2 Vaccination 11/30/2019, 12/25/2019  . Zoster, Live 12/21/2012, 08/05/2013    TDAP status: Due, Education has been provided regarding the importance of this vaccine. Advised may receive this vaccine at local pharmacy or Health Dept. Aware to provide a copy of the vaccination record if obtained from local pharmacy or Health Dept. Verbalized acceptance and understanding.  Flu Vaccine status: Up to date  Pneumococcal vaccine status: Due, Education has been provided regarding the importance of this vaccine. Advised may receive this vaccine at local pharmacy or Health Dept. Aware to provide a copy of the vaccination record if  obtained from local pharmacy or  Health Dept. Verbalized acceptance and understanding.  Covid-19 vaccine status: Completed vaccines  Qualifies for Shingles Vaccine? Yes   Zostavax completed Yes   Shingrix Completed?: No.    Education has been provided regarding the importance of this vaccine. Patient has been advised to call insurance company to determine out of pocket expense if they have not yet received this vaccine. Advised may also receive vaccine at local pharmacy or Health Dept. Verbalized acceptance and understanding.  Screening Tests Health Maintenance  Topic Date Due  . Hepatitis C Screening  Never done  . TETANUS/TDAP  Never done  . COLONOSCOPY (Pts 45-9yrs Insurance coverage will need to be confirmed)  Never done  . Zoster Vaccines- Shingrix (1 of 2) Never done  . PNA vac Low Risk Adult (1 of 2 - PCV13) Never done  . COVID-19 Vaccine (3 - Pfizer risk 4-dose series) 01/22/2020  . INFLUENZA VACCINE  05/25/2021  . HPV VACCINES  Aged Out    Health Maintenance  Health Maintenance Due  Topic Date Due  . Hepatitis C Screening  Never done  . TETANUS/TDAP  Never done  . COLONOSCOPY (Pts 45-43yrs Insurance coverage will need to be confirmed)  Never done  . Zoster Vaccines- Shingrix (1 of 2) Never done  . PNA vac Low Risk Adult (1 of 2 - PCV13) Never done  . COVID-19 Vaccine (3 - Pfizer risk 4-dose series) 01/22/2020    Colorectal cancer screening: Referral to GI placed 03/26/21. Pt aware the office will call re: appt.   Additional Screening:  Hepatitis C Screening: does qualify;   Vision Screening: Recommended annual ophthalmology exams for early detection of glaucoma and other disorders of the eye. Is the patient up to date with their annual eye exam?  Yes  Who is the provider or what is the name of the office in which the patient attends annual eye exams? [facility] in [place]  If pt is not established with a provider, would they like to be referred to a provider to  establish care? No .   Dental Screening: Recommended annual dental exams for proper oral hygiene  Community Resource Referral / Chronic Care Management: CRR required this visit?  No   CCM required this visit?  No      Plan:     I have personally reviewed and noted the following in the patient's chart:   . Medical and social history . Use of alcohol, tobacco or illicit drugs  . Current medications and supplements including opioid prescriptions. Patient is not currently taking opioid prescriptions. . Functional ability and status . Nutritional status . Physical activity . Advanced directives . List of other physicians . Hospitalizations, surgeries, and ER visits in previous 12 months . Vitals . Screenings to include cognitive, depression, and falls . Referrals and appointments  In addition, I have reviewed and discussed with patient certain preventive protocols, quality metrics, and best practice recommendations. A written personalized care plan for preventive services as well as general preventive health recommendations were provided to patient.     Willette Brace, LPN   01/26/9674   Nurse Notes: None

## 2021-04-01 DIAGNOSIS — M79671 Pain in right foot: Secondary | ICD-10-CM | POA: Diagnosis not present

## 2021-04-01 DIAGNOSIS — M79672 Pain in left foot: Secondary | ICD-10-CM | POA: Diagnosis not present

## 2021-04-01 DIAGNOSIS — M25551 Pain in right hip: Secondary | ICD-10-CM | POA: Diagnosis not present

## 2021-04-01 DIAGNOSIS — R269 Unspecified abnormalities of gait and mobility: Secondary | ICD-10-CM | POA: Diagnosis not present

## 2021-04-04 DIAGNOSIS — M1711 Unilateral primary osteoarthritis, right knee: Secondary | ICD-10-CM | POA: Diagnosis not present

## 2021-04-20 DIAGNOSIS — Z01812 Encounter for preprocedural laboratory examination: Secondary | ICD-10-CM | POA: Diagnosis not present

## 2021-04-20 LAB — BASIC METABOLIC PANEL WITH GFR
BUN: 9 (ref 4–21)
CO2: 30 — AB (ref 13–22)
Chloride: 102 (ref 99–108)
Creatinine: 0.7 (ref 0.6–1.3)
Glucose: 74
Potassium: 5.2 (ref 3.4–5.3)
Sodium: 140 (ref 137–147)

## 2021-04-20 LAB — CBC: RBC: 4.73 (ref 3.87–5.11)

## 2021-04-20 LAB — CBC AND DIFFERENTIAL
HCT: 46 (ref 41–53)
Hemoglobin: 14.5 (ref 13.5–17.5)
Platelets: 193 (ref 150–399)
WBC: 8

## 2021-04-20 LAB — COMPREHENSIVE METABOLIC PANEL WITH GFR
Albumin: 4.2 (ref 3.5–5.0)
Calcium: 10.2 (ref 8.7–10.7)
GFR calc Af Amer: 107
GFR calc non Af Amer: 92
Globulin: 2.8

## 2021-04-20 LAB — HEPATIC FUNCTION PANEL
ALT: 13 (ref 10–40)
AST: 17 (ref 14–40)
Alkaline Phosphatase: 55 (ref 25–125)
Bilirubin, Total: 0.8

## 2021-04-22 ENCOUNTER — Ambulatory Visit: Payer: Medicare Other | Admitting: Family Medicine

## 2021-04-22 ENCOUNTER — Encounter: Payer: Self-pay | Admitting: Family Medicine

## 2021-04-22 DIAGNOSIS — M1711 Unilateral primary osteoarthritis, right knee: Secondary | ICD-10-CM | POA: Diagnosis not present

## 2021-04-24 ENCOUNTER — Ambulatory Visit (INDEPENDENT_AMBULATORY_CARE_PROVIDER_SITE_OTHER): Payer: Medicare Other | Admitting: Physician Assistant

## 2021-04-24 ENCOUNTER — Other Ambulatory Visit: Payer: Self-pay

## 2021-04-24 ENCOUNTER — Encounter: Payer: Self-pay | Admitting: Physician Assistant

## 2021-04-24 VITALS — BP 119/79 | HR 75 | Temp 98.2°F | Ht 70.0 in | Wt 200.2 lb

## 2021-04-24 DIAGNOSIS — Z01818 Encounter for other preprocedural examination: Secondary | ICD-10-CM | POA: Diagnosis not present

## 2021-04-24 DIAGNOSIS — M1711 Unilateral primary osteoarthritis, right knee: Secondary | ICD-10-CM

## 2021-04-24 LAB — POCT GLYCOSYLATED HEMOGLOBIN (HGB A1C): Hemoglobin A1C: 5.3 % (ref 4.0–5.6)

## 2021-04-24 NOTE — Patient Instructions (Signed)
Best wishes for your surgery!

## 2021-04-24 NOTE — Progress Notes (Signed)
Established Patient Office Visit  Subjective:  Patient ID: Jesus [name], male    DOB: 1945/12/27  Age: 75 y.o. MRN: [id number]  CC:  Chief Complaint  Patient presents with   Pre-op Exam    HPI Jesus [name] presents for pre-op evaluation. Jesus [name] is scheduled to undergo right knee arthroplasty under spinal anesthesia on 05/01/21 with Dr. Mayer Camel at [facility].  Jesus [name] has no prior history of any problems with anesthesia. Jesus [name] has history of HTN and is currently stable on HCTZ 25 mg daily. Hx of hyperlipidemia and stable on Lipitor 20 mg daily. Jesus [name] has not been taking any NSAIDs. Denies any chest pain, SOB, dizziness, weakness, fever, chills, or recent illness.   Past Medical History:  Diagnosis Date   Anxiety    Arthritis    Cancer ([place])    Colon cancer ([place])    2000   Colon polyp    Depression    GERD (gastroesophageal reflux disease)    Hyperlipidemia    Hypertension    Lung cancer ([place])    2010    Past Surgical History:  Procedure Laterality Date   ANKLE FUSION     crushed ankle while in high school   APPENDECTOMY     1960's   CHOLECYSTECTOMY     COLON SURGERY     PROSTATE SURGERY      Family History  Problem Relation Age of Onset   Heart disease Father    Stroke Brother    Breast cancer Sister    Breast cancer Daughter    Prostate cancer Neg Hx     Social History   Socioeconomic History   Marital status: Married    Spouse name: Not on file   Number of children: 1   Years of education: Not on file   Highest education level: Some college, no degree  Occupational History   Occupation: Retired   Tobacco Use   Smoking status: Former    Pack years: 0.00    Types: Cigarettes    Quit date: 2010    Years since quitting: 12.5   Smokeless tobacco: Never  Substance and Sexual Activity   Alcohol use: Not Currently    Comment: 3 months without a drink   Drug use: Never   Sexual activity: Not Currently  Other Topics Concern   Not on file   Social History Narrative    Retired. Lives part of the time in [place] and is followed by the [facility] there. Lives with spouse. Like to play golf and stay active. Playing golf twice a week. Has begun to swim everyday. No ETOH in 3 months. Relationship with spouse has improved.   Social Determinants of Health   Financial Resource Strain: Low Risk    Difficulty of Paying Living Expenses: Not hard at all  Food Insecurity: No Food Insecurity   Worried About Charity fundraiser in the Last Year: Never true   [place] in the Last Year: Never true  Transportation Needs: No Transportation Needs   Lack of Transportation (Medical): No   Lack of Transportation (Non-Medical): No  Physical Activity: Sufficiently Active   Days of Exercise per Week: 3 days   Minutes of Exercise per Session: 60 min  Stress: No Stress Concern Present   Feeling of Stress : Not at all  Social Connections: Moderately Isolated   Frequency of Communication with Friends and Family: More than three times a week   Frequency of Social Gatherings with  Friends and Family: More than three times a week   Attends Religious Services: Never   Marine scientist or Organizations: No   Attends Music therapist: Never   Marital Status: Married  Human resources officer Violence: Not At Risk   Fear of Current or Ex-Partner: No   Emotionally Abused: No   Physically Abused: No   Sexually Abused: No    Outpatient Medications Prior to Visit  Medication Sig Dispense Refill   alfuzosin (UROXATRAL) 10 MG 24 hr tablet Take 10 mg by mouth daily.     ALPRAZolam (XANAX) 0.5 MG tablet TAKE 1 TABLET BY MOUTH TWICE DAILY AS NEEDED 60 tablet 5   aspirin EC 81 MG tablet Take by mouth.     atorvastatin (LIPITOR) 20 MG tablet Take 1 tablet (20 mg total) by mouth daily. 90 tablet 3   B Complex Vitamins (B COMPLEX PO) Take by mouth.     celecoxib (CELEBREX) 200 MG capsule Take 200 mg by mouth 2 (two) times daily.      citalopram (CELEXA) 20 MG tablet TAKE 1 TABLET(20 MG) BY MOUTH DAILY 30 tablet 2   folic acid (FOLVITE) 1 MG tablet Take by mouth.     hydrochlorothiazide (HYDRODIURIL) 25 MG tablet TAKE 1 TABLET(25 MG) BY MOUTH DAILY 90 tablet 1   ibuprofen (ADVIL) 200 MG tablet Take by mouth.     losartan (COZAAR) 50 MG tablet Take 25mg  (half tablet) dail. 90 tablet 1   Multiple Vitamin (MULTIVITAMIN PO) Take by mouth.     Multiple Vitamins-Minerals (ZINC PO) Take by mouth.     omeprazole (PRILOSEC) 20 MG capsule TAKE 1 CAPSULE(20 MG) BY MOUTH DAILY 90 capsule 0   oxyCODONE (OXY IR/ROXICODONE) 5 MG immediate release tablet Take 5 mg by mouth every 4 (four) hours as needed.     potassium chloride SA (KLOR-CON) 20 MEQ tablet Take 1 tablet (20 mEq total) by mouth daily. 30 tablet 3   tiZANidine (ZANAFLEX) 2 MG tablet Take 2 mg by mouth 2 (two) times daily.     VITAMIN D PO Take 800 Units by mouth.      No facility-administered medications prior to visit.    No Known Allergies  ROS Review of Systems  Constitutional:  Negative for activity change, appetite change and unexpected weight change.  HENT:  Negative for congestion.   Eyes:  Negative for visual disturbance.  Respiratory:  Negative for apnea and shortness of breath.   Cardiovascular:  Negative for chest pain.  Gastrointestinal:  Negative for abdominal pain and blood in stool.  Endocrine: Negative for polydipsia, polyphagia and polyuria.  Genitourinary:  Negative for difficulty urinating.  Musculoskeletal:  Negative for arthralgias.  Skin:  Negative for rash.  Neurological:  Negative for seizures, weakness and headaches.  Psychiatric/Behavioral:  Negative for sleep disturbance and suicidal ideas.      Objective:    Physical Exam Vitals and nursing note reviewed.  Constitutional:      Appearance: Normal appearance. Jesus [name] is normal weight.  HENT:     Head: Normocephalic.     Right Ear: External ear normal.     Left Ear: External ear  normal.     Nose: Nose normal.     Mouth/Throat:     Mouth: Mucous membranes are moist.  Eyes:     Extraocular Movements: Extraocular movements intact.     Pupils: Pupils are equal, round, and reactive to light.  Cardiovascular:     Rate and Rhythm: Normal  rate and regular rhythm.     Pulses: Normal pulses.     Heart sounds: No murmur heard. Pulmonary:     Effort: Pulmonary effort is normal.     Breath sounds: Normal breath sounds.  Musculoskeletal:     Cervical back: Normal range of motion.     Right knee: Swelling present. Decreased range of motion.     Comments: Vertical scar present R knee   Skin:    General: Skin is warm.  Neurological:     General: No focal deficit present.     Mental Status: Jesus [name] is alert and oriented to person, place, and time.    BP 119/79   Pulse 75   Temp 98.2 F (36.8 C)   Ht 5\' 10"  (1.778 m)   Wt 200 lb 3.2 oz (90.8 kg)   SpO2 94%   BMI 28.73 kg/m  Wt Readings from Last 3 Encounters:  04/24/21 200 lb 3.2 oz (90.8 kg)  03/26/21 201 lb 3.2 oz (91.3 kg)  02/11/21 197 lb 3.2 oz (89.4 kg)     Health Maintenance Due  Topic Date Due   Hepatitis C Screening  Never done   TETANUS/TDAP  Never done   Zoster Vaccines- Shingrix (1 of 2) Never done   COLONOSCOPY (Pts 45-75yrs Insurance coverage will need to be confirmed)  Never done   PNA vac Low Risk Adult (1 of 2 - PCV13) Never done   COVID-19 Vaccine (3 - Pfizer risk series) 01/22/2020    There are no preventive care reminders to display for this patient.  Lab Results  Component Value Date   TSH 0.50 02/11/2021   Lab Results  Component Value Date   WBC 8.0 04/20/2021   HGB 14.5 04/20/2021   HCT 46 04/20/2021   MCV 97.2 02/11/2021   PLT 193 04/20/2021   Lab Results  Component Value Date   NA 140 04/20/2021   K 5.2 04/20/2021   CO2 30 (A) 04/20/2021   GLUCOSE 104 (H) 02/24/2021   BUN 9 04/20/2021   CREATININE 0.7 04/20/2021   BILITOT 0.9 02/19/2021   ALKPHOS 55 04/20/2021   AST  17 04/20/2021   ALT 13 04/20/2021   PROT 7.0 02/19/2021   ALBUMIN 4.2 04/20/2021   CALCIUM 10.2 04/20/2021   GFR 92.56 02/24/2021   Lab Results  Component Value Date   CHOL 109 06/29/2019   Lab Results  Component Value Date   HDL 46.70 06/29/2019   Lab Results  Component Value Date   LDLCALC 49 06/29/2019   Lab Results  Component Value Date   TRIG 67.0 06/29/2019   Lab Results  Component Value Date   CHOLHDL 2 06/29/2019   No results found for: HGBA1C    Assessment & Plan:   Problem List Items Addressed This Visit   None Visit Diagnoses     Pre-op exam    -  Primary   Relevant Orders   EKG 12-Lead (Completed)   POCT HgB A1C       No orders of the defined types were placed in this encounter.   Follow-up: No follow-ups on file.   1. Pre-op exam 2. Arthritis of right knee Reviewed ECG and labs in office today. NSR, no ST changes, normal rate on ECG. No lab abnormalities. Ha1c was negative for diabetes. Vitals are normal. No prior issues with anesthesia. His ACS - surgical risk calculator score is below average risk. Jesus [name] is cleared for surgery from a medical and cardiac standpoint.  Form completed for patient in office today.   Total time spent on encounter today was 25 minutes including H & P, reviewing labs and ECG, and documentation.    Mauri Tolen M Dhyan Noah, PA-C

## 2021-04-28 DIAGNOSIS — M1711 Unilateral primary osteoarthritis, right knee: Secondary | ICD-10-CM | POA: Diagnosis not present

## 2021-04-30 ENCOUNTER — Ambulatory Visit: Payer: Medicare Other | Admitting: Nurse Practitioner

## 2021-04-30 ENCOUNTER — Encounter: Payer: Self-pay | Admitting: Nurse Practitioner

## 2021-04-30 VITALS — BP 128/70 | HR 68 | Ht 71.0 in | Wt 203.0 lb

## 2021-04-30 DIAGNOSIS — Z85048 Personal history of other malignant neoplasm of rectum, rectosigmoid junction, and anus: Secondary | ICD-10-CM

## 2021-04-30 DIAGNOSIS — B372 Candidiasis of skin and nail: Secondary | ICD-10-CM

## 2021-04-30 MED ORDER — NYSTATIN-TRIAMCINOLONE 100000-0.1 UNIT/GM-% EX OINT: 1.0000 "application " | TOPICAL_OINTMENT | Freq: Two times a day (BID) | CUTANEOUS | 0 refills | Status: AC

## 2021-04-30 MED ORDER — SUPREP BOWEL PREP KIT 17.5-3.13-1.6 GM/177ML PO SOLN: 1.0000 | ORAL | 0 refills | Status: DC

## 2021-04-30 NOTE — Progress Notes (Signed)
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  He presents to our office today as referred by Dr. Dimas Chyle to schedule a surveillance colonoscopy.  Jesus [name] lives in [place] 6 months yearly and in [place] for the other 6 months.  His most recent colonoscopy was done by Dr. Raelene Bott 09/01/2016 which identified a diminutive polyp removed from the cecum and an 8 mm sessile polyp was removed at 20 cm, diverticulosis and an intact distal anastomosis site was noted.  He denies having any upper or lower abdominal pain.  He passes a firm brown stool a few times daily.  He endorses having 2 to 3 urgent loose nonbloody brown bowel movements once weekly which typically occurs after drinking 2 cups of coffee in the morning.  He takes an OTC antidiarrheal medication if he goes on a long car trips or travels by plane.  He stopped drinking coffee for a few years and then a few months ago when his liver enzymes were mildly elevated he started to drink coffee again as he read an article which reported coffee was beneficial for the liver.  He is not bothered by his bowel pattern and he elects to continue drinking coffee. He has a history of alcohol use disorder.  He reported drinking alcohol since the age of 75.  He was drinking 4-5 vodka cocktails daily or beer but  stopped drinking all alcohol 4 months ago and his liver enzymes normalized.  He has a history of GERD with an esophageal stricture to required esophageal dilatation at least 3 times at the [facility] clinic.  He stated his last EGD with esophageal dilatation was about 4 years ago.  He remains on Omeprazole 20 mg once daily.  He denies having any dysphagia, heartburn or upper abdominal pain.  No melena.  He is scheduled to have a total right knee replacement tomorrow and he was prescribed ASA 81 mg daily and Celebrex by his orthopedist to start postoperatively.  He complains of having a pruritic rash to his right and left groin area.   CBC Latest Ref Rng & Units 04/20/2021 02/11/2021 06/29/2019  WBC - 8.0 5.3 11.0(H)  Hemoglobin 13.5 - 17.5 14.5 15.1 14.2  Hematocrit 41 - 53 46 44.6 41.0  Platelets 150 - 399 193 161.0 288.0     CMP Latest Ref Rng & Units 04/20/2021 02/24/2021 02/19/2021  Glucose 70 - 99 mg/dL - 104(H) 104(H)  BUN 4 - 21 9 6 6   Creatinine 0.6 - 1.3 0.7 0.66 0.75  Sodium 137 - 147 140 136 133(L)  Potassium 3.4 - 5.3 5.2 4.8 5.5(H)  Chloride 99 - 108 102 98 96  CO2 13 - 22 30(A) 31 30  Calcium 8.7 - 10.7 10.2 9.9 10.2  Total Protein 6.0 - 8.3 g/dL - - 7.0  Total Bilirubin 0.2 - 1.2 mg/dL - - 0.9  Alkaline Phos 25 - 125 55 - 60  AST 14 - 40 17 -  38(H)  ALT 10 - 40 13 - 58(H)     Past Medical History:  Diagnosis Date   Anxiety    Arthritis    Cancer ([place])    Colon cancer ([place])    2000   Colon polyp    Depression    GERD (gastroesophageal reflux disease)    Hyperlipidemia    Hypertension    Lung cancer ([place])    2010   Past Surgical History:  Procedure Laterality Date   ANKLE FUSION     crushed ankle while in high school   APPENDECTOMY     1960's   [place]    Patient denies any problems with sedation, general anesthesia or airway management/intubation during any past surgery.  Social History: He is married.  He is  employed in [employer].  He has 1 daughter.  He smoked cigarettes 2 packs/day x 40 years, quit smoking in 2004.  Alcohol use since age 89.  Previously drank 4-5 vodka cocktails or beer daily, no alcohol for the past 4 months.  No drug use.  Family History: Father with heart disease. Brother had a stroke. Sister and daughter with history of breast cancer.  No known family history of esophageal, gastric or colon cancer.  No Known Allergies    Outpatient Encounter Medications as of 04/30/2021  Medication Sig   alfuzosin (UROXATRAL) 10 MG 24 hr tablet Take 10 mg by mouth daily.   ALPRAZolam (XANAX) 0.5 MG tablet TAKE 1 TABLET BY MOUTH TWICE DAILY AS NEEDED   aspirin EC 81 MG tablet Take by mouth.   atorvastatin (LIPITOR) 20 MG tablet Take 1 tablet (20 mg total) by mouth daily.   B Complex Vitamins (B COMPLEX PO) Take by mouth.   celecoxib (CELEBREX) 200 MG capsule Take 200 mg by mouth 2 (two) times daily.   citalopram (CELEXA) 20 MG tablet TAKE 1 TABLET(20 MG) BY MOUTH DAILY   folic acid (FOLVITE) 1 MG tablet Take by mouth.   hydrochlorothiazide (HYDRODIURIL) 25 MG tablet TAKE 1 TABLET(25 MG) BY MOUTH DAILY   ibuprofen (ADVIL) 200 MG tablet Take by mouth.   losartan (COZAAR) 50 MG tablet Take 25mg  (half tablet) dail.   Multiple Vitamin (MULTIVITAMIN PO) Take by mouth.   Multiple Vitamins-Minerals (ZINC PO) Take by mouth.   omeprazole (PRILOSEC) 20 MG capsule TAKE 1 CAPSULE(20 MG) BY MOUTH DAILY   oxyCODONE (OXY IR/ROXICODONE) 5 MG immediate release tablet Take 5 mg by mouth every 4 (four) hours as needed.   potassium chloride SA (KLOR-CON) 20 MEQ tablet Take 1 tablet (20 mEq total) by mouth daily.   tiZANidine (ZANAFLEX) 2 MG tablet Take 2 mg by mouth 2 (two) times daily.   VITAMIN D PO Take 800 Units by mouth.    [DISCONTINUED] potassium chloride (KLOR-CON) 20 MEQ packet Take by mouth 2 (two) times daily.   No facility-administered encounter medications on file as of 04/30/2021.    REVIEW OF  SYSTEMS:  Gen: Denies fever, sweats or chills. No weight loss.  CV: Denies chest pain, palpitations or edema. Resp: Denies cough, shortness of breath of hemoptysis.  GI: Denies heartburn, dysphagia, stomach or lower abdominal pain. No diarrhea or constipation.  GU : + Urinary leakage, excessive urination at time.  MS: + Chronic right knee pain and swelling. Lower back pain. + Muscle cramps.  Derm: Denies rash, itchiness, skin lesions or unhealing ulcers. Psych: + Anxiety.  Heme: Denies bruising,  bleeding. Neuro:  Denies headaches, dizziness or paresthesias. Endo:  Denies any problems with DM, thyroid or adrenal function.  PHYSICAL EXAM: BP 128/70   Pulse 68   Ht 5\' 11"  (1.803 m)   Wt 203 lb (92.1 kg)   BMI 28.31 kg/m   General: 75 year old male in no acute distress. Head: Normocephalic and atraumatic. Eyes:  Sclerae non-icteric, conjunctive pink. Ears: Normal auditory acuity. Mouth: Dentition intact. Multiple capped teeth. No ulcers or lesions.  Neck: Supple, no lymphadenopathy or thyromegaly.  Lungs: Clear bilaterally to auscultation without wheezes, crackles or rhonchi. Heart: Regular rate and rhythm. No murmur, rub or gallop appreciated.  Abdomen: Soft, nontender, non distended. No masses. No hepatosplenomegaly. Normoactive bowel sounds x 4 quadrants. Midline scar intact. RLQ (past colostomy site) intact. Laparoscopic scars intact.  Rectal: Deferred.  Musculoskeletal: Right knee deformity with edema.  Skin: Mild intertrigo appearing rash with distinct borders to bilateral groin area. Extremities: No edema. Neurological: Alert oriented x 4, no focal deficits.  Psychological:  Alert and cooperative. Normal mood and affect.  ASSESSMENT AND PLAN:  36. 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  He is due for surveillance colonoscopy. -Colonoscopy benefits and risks discussed including risk with sedation, risk of bleeding, perforation and infection  -Colonoscopy to be scheduled no earlier than 2 months as he is scheduled for a right total knee replacement 05/01/2021. He will call our office if he develops any post  operative complications -Further recommendations to be determined after colonoscopy completed  2.  History of GERD with esophageal stricture.  GERD symptoms are well controlled on Omeprazole 20 mg once daily.  No recent dysphagia symptoms. -Continue omeprazole 20 mg once daily.  I discussed increasing Omeprazole 20 mg twice daily if he develops any GERD symptoms after he starts aspirin and Celebrex post right knee total replacement surgery  05/01/2021 as prescribed by his orthopedist.  3. Inguinal intertrigo  -Nystatin cream apply a small amount twice daily for 4 weeks, further follow-up and management per PCP     CC:  Vivi Barrack, MD

## 2021-04-30 NOTE — Patient Instructions (Addendum)
If you are age 74 or older, your body mass index should be between 23-30. Your Body mass index is 28.31 kg/m. If this is out of the aforementioned range listed, please consider follow up with your Primary Care Provider.  The [facility] providers would like to encourage you to use [facility] to communicate with providers for non-urgent requests or questions.  Due to long hold times on the telephone, sending your provider a message by [facility] may be faster and more efficient way to get a response. Please allow 48 business hours for a response.  Please remember that this is for non-urgent requests/questions.  PROCEDURES: You have been scheduled for a colonoscopy. Please follow the written instructions given to you at your visit today. Please pick up your prep supplies at the pharmacy within the next 1-3 days. If you use inhalers (even only as needed), please bring them with you on the day of your procedure.  MEDICATION: We have sent the following medication to your pharmacy for you to pick up at your convenience: nystatin-triamcinolone ointment   Apply 1 application topically 2 (two) times daily. Apply a small amount to the right and left groin/thigh area twice daily for 4 weeks  It was great seeing you today! Thank you for entrusting me with your care and choosing [facility].  Noralyn Pick, CRNP

## 2021-04-30 NOTE — Progress Notes (Signed)
Agree with assessment and plan as outlined.  

## 2021-05-01 DIAGNOSIS — M1711 Unilateral primary osteoarthritis, right knee: Secondary | ICD-10-CM | POA: Diagnosis not present

## 2021-05-01 DIAGNOSIS — G8918 Other acute postprocedural pain: Secondary | ICD-10-CM | POA: Diagnosis not present

## 2021-05-02 DIAGNOSIS — Z96651 Presence of right artificial knee joint: Secondary | ICD-10-CM | POA: Diagnosis not present

## 2021-05-02 DIAGNOSIS — Z85118 Personal history of other malignant neoplasm of bronchus and lung: Secondary | ICD-10-CM | POA: Diagnosis not present

## 2021-05-02 DIAGNOSIS — Z471 Aftercare following joint replacement surgery: Secondary | ICD-10-CM | POA: Diagnosis not present

## 2021-05-02 DIAGNOSIS — Z7982 Long term (current) use of aspirin: Secondary | ICD-10-CM | POA: Diagnosis not present

## 2021-05-02 DIAGNOSIS — I10 Essential (primary) hypertension: Secondary | ICD-10-CM | POA: Diagnosis not present

## 2021-05-04 DIAGNOSIS — Z471 Aftercare following joint replacement surgery: Secondary | ICD-10-CM | POA: Diagnosis not present

## 2021-05-04 DIAGNOSIS — Z85118 Personal history of other malignant neoplasm of bronchus and lung: Secondary | ICD-10-CM | POA: Diagnosis not present

## 2021-05-04 DIAGNOSIS — Z96651 Presence of right artificial knee joint: Secondary | ICD-10-CM | POA: Diagnosis not present

## 2021-05-04 DIAGNOSIS — Z7982 Long term (current) use of aspirin: Secondary | ICD-10-CM | POA: Diagnosis not present

## 2021-05-04 DIAGNOSIS — I10 Essential (primary) hypertension: Secondary | ICD-10-CM | POA: Diagnosis not present

## 2021-05-06 DIAGNOSIS — Z7982 Long term (current) use of aspirin: Secondary | ICD-10-CM | POA: Diagnosis not present

## 2021-05-06 DIAGNOSIS — Z85118 Personal history of other malignant neoplasm of bronchus and lung: Secondary | ICD-10-CM | POA: Diagnosis not present

## 2021-05-06 DIAGNOSIS — Z471 Aftercare following joint replacement surgery: Secondary | ICD-10-CM | POA: Diagnosis not present

## 2021-05-06 DIAGNOSIS — I10 Essential (primary) hypertension: Secondary | ICD-10-CM | POA: Diagnosis not present

## 2021-05-06 DIAGNOSIS — Z96651 Presence of right artificial knee joint: Secondary | ICD-10-CM | POA: Diagnosis not present

## 2021-05-07 DIAGNOSIS — Z85118 Personal history of other malignant neoplasm of bronchus and lung: Secondary | ICD-10-CM | POA: Diagnosis not present

## 2021-05-07 DIAGNOSIS — Z96651 Presence of right artificial knee joint: Secondary | ICD-10-CM | POA: Diagnosis not present

## 2021-05-07 DIAGNOSIS — I10 Essential (primary) hypertension: Secondary | ICD-10-CM | POA: Diagnosis not present

## 2021-05-07 DIAGNOSIS — Z471 Aftercare following joint replacement surgery: Secondary | ICD-10-CM | POA: Diagnosis not present

## 2021-05-07 DIAGNOSIS — Z7982 Long term (current) use of aspirin: Secondary | ICD-10-CM | POA: Diagnosis not present

## 2021-05-08 DIAGNOSIS — Z471 Aftercare following joint replacement surgery: Secondary | ICD-10-CM | POA: Diagnosis not present

## 2021-05-08 DIAGNOSIS — Z96651 Presence of right artificial knee joint: Secondary | ICD-10-CM | POA: Diagnosis not present

## 2021-05-08 DIAGNOSIS — I10 Essential (primary) hypertension: Secondary | ICD-10-CM | POA: Diagnosis not present

## 2021-05-08 DIAGNOSIS — Z7982 Long term (current) use of aspirin: Secondary | ICD-10-CM | POA: Diagnosis not present

## 2021-05-08 DIAGNOSIS — Z85118 Personal history of other malignant neoplasm of bronchus and lung: Secondary | ICD-10-CM | POA: Diagnosis not present

## 2021-05-11 DIAGNOSIS — I10 Essential (primary) hypertension: Secondary | ICD-10-CM | POA: Diagnosis not present

## 2021-05-11 DIAGNOSIS — Z96651 Presence of right artificial knee joint: Secondary | ICD-10-CM | POA: Diagnosis not present

## 2021-05-11 DIAGNOSIS — Z471 Aftercare following joint replacement surgery: Secondary | ICD-10-CM | POA: Diagnosis not present

## 2021-05-11 DIAGNOSIS — Z85118 Personal history of other malignant neoplasm of bronchus and lung: Secondary | ICD-10-CM | POA: Diagnosis not present

## 2021-05-11 DIAGNOSIS — Z7982 Long term (current) use of aspirin: Secondary | ICD-10-CM | POA: Diagnosis not present

## 2021-05-12 DIAGNOSIS — Z9889 Other specified postprocedural states: Secondary | ICD-10-CM | POA: Diagnosis not present

## 2021-05-12 DIAGNOSIS — Z96651 Presence of right artificial knee joint: Secondary | ICD-10-CM | POA: Diagnosis not present

## 2021-05-13 DIAGNOSIS — Z96651 Presence of right artificial knee joint: Secondary | ICD-10-CM | POA: Diagnosis not present

## 2021-05-15 DIAGNOSIS — Z96651 Presence of right artificial knee joint: Secondary | ICD-10-CM | POA: Diagnosis not present

## 2021-05-19 DIAGNOSIS — Z96651 Presence of right artificial knee joint: Secondary | ICD-10-CM | POA: Diagnosis not present

## 2021-05-20 DIAGNOSIS — Z96651 Presence of right artificial knee joint: Secondary | ICD-10-CM | POA: Diagnosis not present

## 2021-05-22 DIAGNOSIS — Z96651 Presence of right artificial knee joint: Secondary | ICD-10-CM | POA: Diagnosis not present

## 2021-05-27 DIAGNOSIS — Z96651 Presence of right artificial knee joint: Secondary | ICD-10-CM | POA: Diagnosis not present

## 2021-05-29 DIAGNOSIS — Z96651 Presence of right artificial knee joint: Secondary | ICD-10-CM | POA: Diagnosis not present

## 2021-06-02 DIAGNOSIS — Z96651 Presence of right artificial knee joint: Secondary | ICD-10-CM | POA: Diagnosis not present

## 2021-06-04 DIAGNOSIS — Z96651 Presence of right artificial knee joint: Secondary | ICD-10-CM | POA: Diagnosis not present

## 2021-06-08 ENCOUNTER — Telehealth: Payer: Self-pay | Admitting: Family Medicine

## 2021-06-08 DIAGNOSIS — L570 Actinic keratosis: Secondary | ICD-10-CM | POA: Diagnosis not present

## 2021-06-08 DIAGNOSIS — D229 Melanocytic nevi, unspecified: Secondary | ICD-10-CM | POA: Diagnosis not present

## 2021-06-08 DIAGNOSIS — L905 Scar conditions and fibrosis of skin: Secondary | ICD-10-CM | POA: Diagnosis not present

## 2021-06-08 DIAGNOSIS — D485 Neoplasm of uncertain behavior of skin: Secondary | ICD-10-CM | POA: Diagnosis not present

## 2021-06-08 DIAGNOSIS — L821 Other seborrheic keratosis: Secondary | ICD-10-CM | POA: Diagnosis not present

## 2021-06-08 DIAGNOSIS — Z85828 Personal history of other malignant neoplasm of skin: Secondary | ICD-10-CM | POA: Diagnosis not present

## 2021-06-08 DIAGNOSIS — L814 Other melanin hyperpigmentation: Secondary | ICD-10-CM | POA: Diagnosis not present

## 2021-06-08 NOTE — Chronic Care Management (AMB) (Signed)
  Care Management  Note   06/08/2021 Name: Jesus [name] MRN: [id number] DOB: 03/01/1946  Jesus [name] is a 75 y.o. year old male who is a primary care patient of Vivi Barrack, MD. The care management team was consulted for assistance with chronic disease management and care coordination needs.   Jesus [name] was given information about Care Management services today including:  CCM service includes personalized support from designated clinical staff supervised by the physician, including individualized plan of care and coordination with other care providers 24/7 contact phone numbers for assistance for urgent and routine care needs. Service will only be billed when office clinical staff spend 20 minutes or more in a month to coordinate care. Only one practitioner may furnish and bill the service in a calendar month. The patient may stop CCM services at amy time (effective at the end of the month) by phone call to the office staff. The patient will be responsible for cost sharing (co-pay) or up to 20% of the service fee (after annual deductible is met)  Patient agreed to services and verbal consent obtained.  Follow up plan:   Face to Face appointment with care management team member scheduled for: 08/03/21 $RemoveBefor'@11am'[id number]$   Noelle Penner Upstream Scheduler

## 2021-06-09 DIAGNOSIS — Z96651 Presence of right artificial knee joint: Secondary | ICD-10-CM | POA: Diagnosis not present

## 2021-06-12 DIAGNOSIS — Z96651 Presence of right artificial knee joint: Secondary | ICD-10-CM | POA: Diagnosis not present

## 2021-06-15 DIAGNOSIS — M25521 Pain in right elbow: Secondary | ICD-10-CM | POA: Diagnosis not present

## 2021-06-15 DIAGNOSIS — Z96651 Presence of right artificial knee joint: Secondary | ICD-10-CM | POA: Diagnosis not present

## 2021-06-18 DIAGNOSIS — Z96651 Presence of right artificial knee joint: Secondary | ICD-10-CM | POA: Diagnosis not present

## 2021-06-23 DIAGNOSIS — Z96651 Presence of right artificial knee joint: Secondary | ICD-10-CM | POA: Diagnosis not present

## 2021-06-26 DIAGNOSIS — Z96651 Presence of right artificial knee joint: Secondary | ICD-10-CM | POA: Diagnosis not present

## 2021-07-06 ENCOUNTER — Ambulatory Visit: Payer: Medicare Other | Admitting: Behavioral Health

## 2021-07-13 DIAGNOSIS — M1711 Unilateral primary osteoarthritis, right knee: Secondary | ICD-10-CM | POA: Diagnosis not present

## 2021-07-13 DIAGNOSIS — I10 Essential (primary) hypertension: Secondary | ICD-10-CM | POA: Diagnosis not present

## 2021-07-13 DIAGNOSIS — Z96651 Presence of right artificial knee joint: Secondary | ICD-10-CM | POA: Diagnosis not present

## 2021-07-13 DIAGNOSIS — M7061 Trochanteric bursitis, right hip: Secondary | ICD-10-CM | POA: Diagnosis not present

## 2021-07-23 ENCOUNTER — Encounter: Payer: Self-pay | Admitting: Family

## 2021-07-23 ENCOUNTER — Ambulatory Visit (INDEPENDENT_AMBULATORY_CARE_PROVIDER_SITE_OTHER): Payer: Medicare Other | Admitting: Family

## 2021-07-23 ENCOUNTER — Other Ambulatory Visit: Payer: Self-pay

## 2021-07-23 ENCOUNTER — Ambulatory Visit (INDEPENDENT_AMBULATORY_CARE_PROVIDER_SITE_OTHER)
Admission: RE | Admit: 2021-07-23 | Discharge: 2021-07-23 | Disposition: A | Discharge status: Home / Self Care | Payer: Medicare Other | Source: Ambulatory Visit | Attending: Family | Admitting: Family

## 2021-07-23 VITALS — BP 134/90 | HR 85 | Temp 97.6°F | Ht 71.0 in | Wt 205.8 lb

## 2021-07-23 DIAGNOSIS — R059 Cough, unspecified: Secondary | ICD-10-CM

## 2021-07-23 DIAGNOSIS — J449 Chronic obstructive pulmonary disease, unspecified: Secondary | ICD-10-CM | POA: Diagnosis not present

## 2021-07-23 DIAGNOSIS — Z85118 Personal history of other malignant neoplasm of bronchus and lung: Secondary | ICD-10-CM

## 2021-07-23 DIAGNOSIS — R062 Wheezing: Secondary | ICD-10-CM

## 2021-07-23 DIAGNOSIS — H6123 Impacted cerumen, bilateral: Secondary | ICD-10-CM | POA: Diagnosis not present

## 2021-07-23 DIAGNOSIS — Z87891 Personal history of nicotine dependence: Secondary | ICD-10-CM

## 2021-07-23 DIAGNOSIS — J439 Emphysema, unspecified: Secondary | ICD-10-CM | POA: Diagnosis not present

## 2021-07-23 DIAGNOSIS — H9202 Otalgia, left ear: Secondary | ICD-10-CM | POA: Diagnosis not present

## 2021-07-23 MED ORDER — PREDNISONE 10 MG (21) PO TBPK: ORAL_TABLET | ORAL | 0 refills | Status: DC

## 2021-07-23 MED ORDER — DOXYCYCLINE HYCLATE 100 MG PO TABS: 100.0000 mg | ORAL_TABLET | Freq: Two times a day (BID) | ORAL | 0 refills | Status: DC

## 2021-07-23 NOTE — Progress Notes (Signed)
Acute Office Visit  Subjective:    Patient ID: Jesus [name], male    DOB: [birth year], 75 y.o.   MRN: [id number]  Chief Complaint  Patient presents with  . Ear Pain    Left ear; pt states that it started several days. It only hurts at night.   . Abdominal Pain  . Diarrhea    HPI Patient is in today with c/o left ear that has been 5-6/10 in pain, cough, sore throat x 1 week. Has been taking allergy medication that help some. Has a history of lung and colon cancer. Has a lobectomy as a result. Has been stable. Smoked 15 years ago but quit. Using sore throat lozenges that have been helpful.    Past Medical History:  Diagnosis Date  . Anxiety   . Arthritis   . Bowel obstruction ([place])   . Cancer ([place])   . Colon cancer ([place])    2000  . Colon polyp   . Depression   . Elevated LFTs   . Gallstones   . GERD (gastroesophageal reflux disease)   . Hyperlipidemia   . Hypertension   . Lung cancer ([place])    2010    Past Surgical History:  Procedure Laterality Date  . ANKLE FUSION Left    crushed ankle while in high school  . APPENDECTOMY     1960's  . CHOLECYSTECTOMY    . COLON SURGERY     with reanastomosis  . PROSTATE SURGERY      Family History  Problem Relation Age of Onset  . Heart disease Father   . Breast cancer Sister   . Stroke Brother   . Breast cancer Daughter   . Prostate cancer Neg Hx   . Colon cancer Neg Hx   . Stomach cancer Neg Hx   . Pancreatic cancer Neg Hx   . Esophageal cancer Neg Hx   . Liver disease Neg Hx     Social History   Socioeconomic History  . Marital status: Married    Spouse name: Not on file  . Number of children: 1  . Years of education: Not on file  . Highest education level: Some college, no degree  Occupational History  . Occupation: Retired   Tobacco Use  . Smoking status: Former    Types: Cigarettes    Quit date: 2010    Years since quitting: 12.7  . Smokeless tobacco: Never  Substance and Sexual Activity  .  Alcohol use: Not Currently    Comment: as of 04/30/21, 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 Previously heavy user  . Drug use: Never  . Sexual activity: Not Currently  Other Topics Concern  . Not on file  Social History Narrative    Retired. Lives part of the time in [place] and is followed by the [facility] there. Lives with spouse. Like to play golf and stay active. Playing golf twice a week. Has begun to swim everyday. No ETOH in 3 months. Relationship with spouse has improved.   Social Determinants of Health   Financial Resource Strain: Low Risk   . Difficulty of Paying Living Expenses: Not hard at all  Food Insecurity: No Food Insecurity  . Worried About Charity fundraiser in the Last Year: Never true  . Ran Out of Food in the Last Year: Never true  Transportation Needs: No Transportation Needs  . Lack of Transportation (Medical): No  . Lack of Transportation (Non-Medical): No  Physical Activity: Sufficiently Active  .  Days of Exercise per Week: 3 days  . Minutes of Exercise per Session: 60 min  Stress: No Stress Concern Present  . Feeling of Stress : Not at all  Social Connections: Moderately Isolated  . Frequency of Communication with Friends and Family: More than three times a week  . Frequency of Social Gatherings with Friends and Family: More than three times a week  . Attends Religious Services: Never  . Active Member of Clubs or Organizations: No  . Attends Archivist Meetings: Never  . Marital Status: Married  Human resources officer Violence: Not At Risk  . Fear of Current or Ex-Partner: No  . Emotionally Abused: No  . Physically Abused: No  . Sexually Abused: No    Outpatient Medications Prior to Visit  Medication Sig Dispense Refill  . acetaminophen (TYLENOL) 500 MG tablet Take 500 mg by mouth every 6 (six) hours as needed.    Marland Kitchen alfuzosin (UROXATRAL) 10 MG 24 hr tablet Take 10 mg by mouth daily.    Marland Kitchen ALPRAZolam (XANAX) 0.5 MG tablet TAKE 1 TABLET BY MOUTH  TWICE DAILY AS NEEDED 60 tablet 5  . atorvastatin (LIPITOR) 20 MG tablet Take 1 tablet (20 mg total) by mouth daily. 90 tablet 3  . B Complex Vitamins (B COMPLEX PO) Take by mouth.    . citalopram (CELEXA) 20 MG tablet TAKE 1 TABLET(20 MG) BY MOUTH DAILY 30 tablet 2  . folic acid (FOLVITE) 1 MG tablet Take 1 mg by mouth daily.    Marland Kitchen ibuprofen (ADVIL) 200 MG tablet Take 200 mg by mouth as needed.    Marland Kitchen losartan (COZAAR) 50 MG tablet Take 24m (half tablet) dail. 90 tablet 1  . Multiple Vitamin (MULTIVITAMIN PO) Take 1 tablet by mouth daily.    . Multiple Vitamins-Minerals (ZINC PO) Take 1 tablet by mouth daily.    .Marland Kitchenomeprazole (PRILOSEC) 20 MG capsule TAKE 1 CAPSULE(20 MG) BY MOUTH DAILY 90 capsule 0  . SUPREP BOWEL PREP KIT 17.5-3.13-1.6 GM/177ML SOLN Take 1 kit by mouth as directed. For colonoscopy prep 354 mL 0  . VITAMIN D PO Take 800 Units by mouth.     .Marland Kitchenaspirin EC 81 MG tablet Take by mouth. (Patient not taking: No sig reported)    . celecoxib (CELEBREX) 200 MG capsule Take 200 mg by mouth 2 (two) times daily. (Patient not taking: No sig reported)    . oxyCODONE (OXY IR/ROXICODONE) 5 MG immediate release tablet Take 5 mg by mouth every 4 (four) hours as needed. (Patient not taking: Reported on 04/30/2021)     No facility-administered medications prior to visit.    No Known Allergies  Review of Systems  Constitutional: Negative.   HENT:  Positive for congestion and ear pain.   Respiratory:  Positive for cough.   Cardiovascular: Negative.  Negative for chest pain.  Gastrointestinal: Negative.   Endocrine: Negative.   Musculoskeletal: Negative.   Skin:  Positive for color change.  Allergic/Immunologic: Negative.   Neurological: Negative.   Hematological: Negative.   Psychiatric/Behavioral: Negative.    All other systems reviewed and are negative.     Objective:    Physical Exam Vitals reviewed.  Constitutional:      Appearance: He is well-developed.  Eyes:     Extraocular  Movements: Extraocular movements intact.     Pupils: Pupils are equal, round, and reactive to light.  Cardiovascular:     Rate and Rhythm: Normal rate and regular rhythm.  Pulmonary:  Effort: Pulmonary effort is normal.     Breath sounds: Wheezing present. No rhonchi.     Comments: Wheezing noted to the left side Chest:     Chest wall: No tenderness.  Abdominal:     Palpations: Abdomen is soft.  Skin:    General: Skin is warm and dry.  Neurological:     General: No focal deficit present.     Mental Status: He is alert and oriented to person, place, and time.  Psychiatric:        Mood and Affect: Mood normal.        Behavior: Behavior normal.   BP 134/90   Pulse 85   Temp 97.6 F (36.4 C) (Temporal)   Ht 5' 11"  (1.803 m)   Wt 205 lb 12.8 oz (93.4 kg)   SpO2 96%   BMI 28.70 kg/m  Wt Readings from Last 3 Encounters:  07/23/21 205 lb 12.8 oz (93.4 kg)  04/30/21 203 lb (92.1 kg)  04/24/21 200 lb 3.2 oz (90.8 kg)    Health Maintenance Due  Topic Date Due  . Hepatitis C Screening  Never done  . TETANUS/TDAP  Never done  . Zoster Vaccines- Shingrix (1 of 2) Never done  . COLONOSCOPY (Pts 45-94yr Insurance coverage will need to be confirmed)  Never done  . INFLUENZA VACCINE  05/25/2021    There are no preventive care reminders to display for this patient.   Lab Results  Component Value Date   TSH 0.50 02/11/2021   Lab Results  Component Value Date   WBC 8.0 04/20/2021   HGB 14.5 04/20/2021   HCT 46 04/20/2021   MCV 97.2 02/11/2021   PLT 193 04/20/2021   Lab Results  Component Value Date   NA 140 04/20/2021   K 5.2 04/20/2021   CO2 30 (A) 04/20/2021   GLUCOSE 104 (H) 02/24/2021   BUN 9 04/20/2021   CREATININE 0.7 04/20/2021   BILITOT 0.9 02/19/2021   ALKPHOS 55 04/20/2021   AST 17 04/20/2021   ALT 13 04/20/2021   PROT 7.0 02/19/2021   ALBUMIN 4.2 04/20/2021   CALCIUM 10.2 04/20/2021   GFR 92.56 02/24/2021   Lab Results  Component Value Date    CHOL 109 06/29/2019   Lab Results  Component Value Date   HDL 46.70 06/29/2019   Lab Results  Component Value Date   LDLCALC 49 06/29/2019   Lab Results  Component Value Date   TRIG 67.0 06/29/2019   Lab Results  Component Value Date   CHOLHDL 2 06/29/2019   Lab Results  Component Value Date   HGBA1C 5.3 04/24/2021       Assessment & Plan:   Problem List Items Addressed This Visit   None Visit Diagnoses     Wheezing on left side of chest on inhalation    -  Primary   Relevant Orders   DG Chest 2 View   CBC w/Diff   Cough       Relevant Orders   DG Chest 2 View   CBC w/Diff   Left ear pain       Bilateral impacted cerumen       History of lung cancer       Relevant Orders   DG Chest 2 View   History of smoking       Relevant Orders   DG Chest 2 View        Meds ordered this encounter  Medications  . doxycycline (VIBRA-TABS)  100 MG tablet    Sig: Take 1 tablet (100 mg total) by mouth 2 (two) times daily.    Dispense:  20 tablet    Refill:  0  . predniSONE (STERAPRED UNI-PAK 21 TAB) 10 MG (21) TBPK tablet    Sig: As directed    Dispense:  21 tablet    Refill:  0   Plan: Call the office if symptoms worsen or persist. Recheck pending CXR and sooner as needed  Kennyth Arnold, FNP

## 2021-07-24 ENCOUNTER — Other Ambulatory Visit: Payer: Self-pay | Admitting: Family Medicine

## 2021-07-27 ENCOUNTER — Ambulatory Visit: Payer: Medicare Other

## 2021-07-28 ENCOUNTER — Encounter: Payer: Medicare Other | Admitting: Gastroenterology

## 2021-08-03 ENCOUNTER — Ambulatory Visit: Payer: Medicare Other

## 2021-08-04 ENCOUNTER — Ambulatory Visit: Payer: Medicare Other

## 2021-08-11 DIAGNOSIS — Z96651 Presence of right artificial knee joint: Secondary | ICD-10-CM | POA: Diagnosis not present

## 2021-08-11 DIAGNOSIS — M6281 Muscle weakness (generalized): Secondary | ICD-10-CM | POA: Diagnosis not present

## 2021-08-11 DIAGNOSIS — M25651 Stiffness of right hip, not elsewhere classified: Secondary | ICD-10-CM | POA: Diagnosis not present

## 2021-08-11 DIAGNOSIS — M25661 Stiffness of right knee, not elsewhere classified: Secondary | ICD-10-CM | POA: Diagnosis not present

## 2021-08-12 ENCOUNTER — Telehealth: Payer: Self-pay | Admitting: Pharmacist

## 2021-08-12 NOTE — Progress Notes (Signed)
    Chronic Care Management Pharmacy Assistant   Name: Jesus [name]  MRN: [id number] DOB: 1946-05-06   Reason for Encounter: CMA phone call / Reschedule cancelled initial visit with CPP   Called and spoke to patient to reschedule cancelled initial CPP visit. He states he is currently in [place] for the winter and will not be returning to the area until Spring. He declined rescheduling CPP visit at this time.  Jobe Gibbon, [place] Pharmacist Assistant  [phone]  Time Spent: 15 minutes  Future Appointments  Date Time Provider [place]  04/01/2022  3:15 PM [facility] COACH [facility]

## 2021-08-28 ENCOUNTER — Telehealth: Payer: Self-pay | Admitting: Pharmacist

## 2021-08-28 NOTE — Chronic Care Management (AMB) (Signed)
  Chronic Care Management Pharmacy Assistant   Name: Jesus [name]  MRN: [id number] DOB: 03/17/1946   Reason for Encounter: Chart Review For Initial Visit With Clinical Pharmacist   Conditions to be addressed/monitored: HTN, Lung Cancer, Color Cancer, GERD, BPH, Dyslipidemia, Anxiety, Vitamin B12 deficiency, Hypophosphatemia, Hypomagnesemia, Hypokalemia  Primary concerns for visit include: HTN, Dyslipidemia, Arthritis  Recent office visits:  07/23/2021 OV Webb, Padonda B, FNP; Doxycycline 100 mg two times daily, Prednisone 10 mg TBPK tablet as directed for cough  04/24/2021 OV Allwardt, Alyssa M, PA-C; no medication changes indicated.  Recent consult visits:  04/30/2021 OV (gastro) Kennedy-Smith, Colleen M, NP; -Continue omeprazole 20 mg once daily.  I discussed increasing Omeprazole 20 mg twice daily if he develops any GERD symptoms after he starts aspirin and Celebrex post right knee total replacement surgery  05/01/2021 as prescribed by his orthopedist  04/22/2021 OV (orthopedic surgery) Rowan, Frank, no further information available.  04/04/2021 OV (orthopedic surgery) Rowan, Frank, no further information available.  03/26/2021 OV (behavioral health) White, Brian A, NP; no further information available.  Hospital visits:  None in previous 6 months  Medications: Outpatient Encounter Medications as of 08/28/2021  Medication Sig   acetaminophen (TYLENOL) 500 MG tablet Take 500 mg by mouth every 6 (six) hours as needed.   alfuzosin (UROXATRAL) 10 MG 24 hr tablet Take 10 mg by mouth daily.   ALPRAZolam (XANAX) 0.5 MG tablet TAKE 1 TABLET BY MOUTH TWICE DAILY AS NEEDED   atorvastatin (LIPITOR) 20 MG tablet Take 1 tablet (20 mg total) by mouth daily.   B Complex Vitamins (B COMPLEX PO) Take by mouth.   citalopram (CELEXA) 20 MG tablet TAKE 1 TABLET(20 MG) BY MOUTH DAILY   doxycycline (VIBRA-TABS) 100 MG tablet Take 1 tablet (100 mg total) by mouth 2 (two) times daily.    folic acid (FOLVITE) 1 MG tablet Take 1 mg by mouth daily.   ibuprofen (ADVIL) 200 MG tablet Take 200 mg by mouth as needed.   losartan (COZAAR) 50 MG tablet Take 25mg (half tablet) dail.   Multiple Vitamin (MULTIVITAMIN PO) Take 1 tablet by mouth daily.   Multiple Vitamins-Minerals (ZINC PO) Take 1 tablet by mouth daily.   omeprazole (PRILOSEC) 20 MG capsule TAKE 1 CAPSULE(20 MG) BY MOUTH DAILY   predniSONE (STERAPRED UNI-PAK 21 TAB) 10 MG (21) TBPK tablet As directed   SUPREP BOWEL PREP KIT 17.5-3.13-1.6 GM/177ML SOLN Take 1 kit by mouth as directed. For colonoscopy prep   VITAMIN D PO Take 800 Units by mouth.    [DISCONTINUED] potassium chloride (KLOR-CON) 20 MEQ packet Take by mouth 2 (two) times daily.   No facility-administered encounter medications on file as of 08/28/2021.   Current Medications:  Alprazolam 0.5 mg last filled 08/24/2021 30 DS Doxycyline 100 mg last filled 07/23/2021 10 DS Acetaminophen 500 mg Suprep Bowl Prep Kit last filled 04/30/2021 1 DS Alfuzosin 10 mg last filled 02/12/2021 90 DS - patient not sure if taking Folic Acid 1 mg last filled 01/27/2021 90 DS - no longer has on hand Ibuprofen 200 mg Citalopram 20 mg last filled 03/16/2021 30 DS Losartan 50 mg last filled 07/24/2021 90 DS Omeprazole 20 mg last filled 05/26/2021 90 DS Atorvastatin 20 mg last filled 07/24/2021 90 DS Multiple Vitamin Vitamin D B Complex Multiple Vitamin  Patient Questions: Any changes in your medications or health? Patient states he hasn't had any recent changes in his medications or health.  Any side effects from any medications?  Patient   states he does not have any side effects from any of his medications.  Do you have any symptoms or problems not managed by your medications? Patient states he does not have any problems or symptoms that are not managed by his medications.  Any concerns about your health right now? Patient states his only concern at this time is his  arthritis.  Has your provider asked that you check blood pressure, blood sugar, or follow special diet at home? Patient states he does not check his blood pressure or blood sugar. He does not follow a special diet.  Do you get any type of exercise on a regular basis? Patient states he plays a lot of golf.  Can you think of a goal you would like to reach for your health? Patient states his goal is to have less arthritis in his joints.  Do you have any problems getting your medications? He denies having any problems getting his medications.  Is there anything that you would like to discuss during the appointment?  Patient states he would like to discuss all of his medications in general.  Please bring medications and supplements to appointment   Care Gaps: Medicare Annual Wellness: Completed Hemoglobin A1C: 5.3% on 04/24/2021 Colonoscopy: Overdue - never done  Future Appointments  Date Time Provider [place]  08/31/2021  3:30 PM [facility] CCM PHARMACIST [facility]  04/01/2022  3:15 PM [facility] HEALTH COACH [facility]    Star Rating Drugs: Losartan 50 mg last filled 07/24/2021 90 DS Atorvastatin 20 mg last filled 07/24/2021 90 DS  April D Calhoun, [place] Pharmacist Assistant [phone]

## 2021-08-31 ENCOUNTER — Other Ambulatory Visit: Payer: Self-pay

## 2021-08-31 ENCOUNTER — Ambulatory Visit (INDEPENDENT_AMBULATORY_CARE_PROVIDER_SITE_OTHER): Payer: Medicare Other | Admitting: Pharmacist

## 2021-08-31 ENCOUNTER — Ambulatory Visit (INDEPENDENT_AMBULATORY_CARE_PROVIDER_SITE_OTHER): Payer: Medicare Other | Admitting: *Deleted

## 2021-08-31 DIAGNOSIS — Z23 Encounter for immunization: Secondary | ICD-10-CM | POA: Diagnosis not present

## 2021-08-31 DIAGNOSIS — I10 Essential (primary) hypertension: Secondary | ICD-10-CM

## 2021-08-31 DIAGNOSIS — E785 Hyperlipidemia, unspecified: Secondary | ICD-10-CM

## 2021-08-31 DIAGNOSIS — E7800 Pure hypercholesterolemia, unspecified: Secondary | ICD-10-CM

## 2021-08-31 NOTE — Progress Notes (Signed)
Chronic Care Management Pharmacy Note  08/31/2021 Name:  Jesus [name] MRN:  [id number] DOB:  12-Jul-1946  Summary: Initial PharmD visit.  Meds reviewed.  Patient does mention dry cough that has been persistent for years.  Med list updated, he was no longer taking Alfuzosin or Potassium.  Recommendations/Changes made from today's visit: Monitor cough - could be losartan related  Plan: FU 6 months   Subjective: Jesus [name] is an 75 y.o. year old male who is a primary patient of [name], Algis Greenhouse, MD.  The CCM team was consulted for assistance with disease management and care coordination needs.    Engaged with patient face to face for initial visit in response to provider referral for pharmacy case management and/or care coordination services.   Consent to Services:  The patient was given the following information about Chronic Care Management services today, agreed to services, and gave verbal consent: 1. CCM service includes personalized support from designated clinical staff supervised by the primary care provider, including individualized plan of care and coordination with other care providers 2. 24/7 contact phone numbers for assistance for urgent and routine care needs. 3. Service will only be billed when office clinical staff spend 20 minutes or more in a month to coordinate care. 4. Only one practitioner may furnish and bill the service in a calendar month. 5.The patient may stop CCM services at any time (effective at the end of the month) by phone call to the office staff. 6. The patient will be responsible for cost sharing (co-pay) of up to 20% of the service fee (after annual deductible is met). Patient agreed to services and consent obtained.  Patient Care Team: Vivi Barrack, MD as PCP - General (Family Medicine) Laurin Coder, MD as Consulting Physician (Pulmonary Disease) [place], Jennefer Bravo, MD as Consulting Physician (Dermatology) Belva Crome, MD  as Consulting Physician (Cardiology) Edythe Clarity, [facility] as Pharmacist (Pharmacist)  Recent office visits:  07/23/2021 Murlean Caller, Derrell Lolling, FNP; Doxycycline 100 mg two times daily, Prednisone 10 mg TBPK tablet as directed for cough   04/24/2021 OV Allwardt, Alyssa M, PA-C; no medication changes indicated.   Recent consult visits:  04/30/2021 OV (gastro) Noralyn Pick, NP; -Continue omeprazole 20 mg once daily.  I discussed increasing Omeprazole 20 mg twice daily if he develops any GERD symptoms after he starts aspirin and Celebrex post right knee total replacement surgery  05/01/2021 as prescribed by his orthopedist   04/22/2021 OV (orthopedic surgery) Frederik Pear, no further information available.   04/04/2021 OV (orthopedic surgery) Frederik Pear, no further information available.   03/26/2021 OV (behavioral health) Elwanda Brooklyn, NP; no further information available.   Hospital visits:  None in previous 6 months  Objective:  Lab Results  Component Value Date   CREATININE 0.7 04/20/2021   BUN 9 04/20/2021   GFR 92.56 02/24/2021   GFRNONAA 92 04/20/2021   GFRAA 107 04/20/2021   NA 140 04/20/2021   K 5.2 04/20/2021   CALCIUM 10.2 04/20/2021   CO2 30 (A) 04/20/2021   GLUCOSE 104 (H) 02/24/2021    Lab Results  Component Value Date/Time   HGBA1C 5.3 04/24/2021 03:14 PM   GFR 92.56 02/24/2021 08:42 AM   GFR 89.07 02/19/2021 09:12 AM    Last diabetic Eye exam: No results found for: HMDIABEYEEXA  Last diabetic Foot exam: No results found for: HMDIABFOOTEX   Lab Results  Component Value Date   CHOL 109 06/29/2019   HDL 46.70  06/29/2019   LDLCALC 49 06/29/2019   TRIG 67.0 06/29/2019   CHOLHDL 2 06/29/2019    Hepatic Function Latest Ref Rng & Units 04/20/2021 02/19/2021 02/11/2021  Total Protein 6.0 - 8.3 g/dL - 7.0 7.0  Albumin 3.5 - 5.0 4.2 4.4 4.3  AST 14 - 40 17 38(H) 93(H)  ALT 10 - 40 13 58(H) 106(H)  Alk Phosphatase 25 - 125 55 60 73  Total  Bilirubin 0.2 - 1.2 mg/dL - 0.9 0.7  Bilirubin, Direct 0.0 - 0.3 mg/dL - 0.2 -    Lab Results  Component Value Date/Time   TSH 0.50 02/11/2021 11:34 AM   TSH 0.81 07/30/2019 10:44 AM   FREET4 0.84 07/30/2019 10:44 AM    CBC Latest Ref Rng & Units 04/20/2021 02/11/2021 06/29/2019  WBC - 8.0 5.3 11.0(H)  Hemoglobin 13.5 - 17.5 14.5 15.1 14.2  Hematocrit 41 - 53 46 44.6 41.0  Platelets 150 - 399 193 161.0 288.0    No results found for: VD25OH  Clinical ASCVD: No  The ASCVD Risk score (Arnett DK, et al., 2019) failed to calculate for the following reasons:   The valid HDL cholesterol range is 0.517 to 2.586 mmol/L   The valid total cholesterol range is 3.362 to 8.275 mmol/L    Depression screen [facility] 2/9 07/23/2021 03/26/2021 01/24/2020  Decreased Interest 0 0 0  Down, Depressed, Hopeless 0 0 0  PHQ - 2 Score 0 0 0  Some encounter information is confidential and restricted. Go to Review Flowsheets activity to see all data.      Social History   Tobacco Use  Smoking Status Former   Types: Cigarettes   Quit date: 2010   Years since quitting: 12.8  Smokeless Tobacco Never   BP Readings from Last 3 Encounters:  07/23/21 134/90  04/30/21 128/70  04/24/21 119/79   Pulse Readings from Last 3 Encounters:  07/23/21 85  04/30/21 68  04/24/21 75   Wt Readings from Last 3 Encounters:  07/23/21 205 lb 12.8 oz (93.4 kg)  04/30/21 203 lb (92.1 kg)  04/24/21 200 lb 3.2 oz (90.8 kg)   BMI Readings from Last 3 Encounters:  07/23/21 28.70 kg/m  04/30/21 28.31 kg/m  04/24/21 28.73 kg/m    Assessment/Interventions: Review of patient past medical history, allergies, medications, health status, including review of consultants reports, laboratory and other test data, was performed as part of comprehensive evaluation and provision of chronic care management services.   SDOH:  (Social Determinants of Health) assessments and interventions performed: Yes  Financial Resource Strain: Low  Risk    Difficulty of Paying Living Expenses: Not hard at all    SDOH Screenings   Alcohol Screen: Not on file  Depression (PHQ2-9): Low Risk    PHQ-2 Score: 0  Financial Resource Strain: Low Risk    Difficulty of Paying Living Expenses: Not hard at all  Food Insecurity: No Food Insecurity   Worried About Charity fundraiser in the Last Year: Never true   Ran Out of Food in the Last Year: Never true  Housing: Low Risk    Last Housing Risk Score: 0  Physical Activity: Sufficiently Active   Days of Exercise per Week: 3 days   Minutes of Exercise per Session: 60 min  Social Connections: Moderately Isolated   Frequency of Communication with Friends and Family: More than three times a week   Frequency of Social Gatherings with Friends and Family: More than three times a week  Attends Religious Services: Never   Active Member of Clubs or Organizations: No   Attends Archivist Meetings: Never   Marital Status: Married  Stress: No Stress Concern Present   Feeling of Stress : Not at all  Tobacco Use: Medium Risk   Smoking Tobacco Use: Former   Smokeless Tobacco Use: Never   Passive Exposure: Not on file  Transportation Needs: No Transportation Needs   Lack of Transportation (Medical): No   Lack of Transportation (Non-Medical): No    CCM Care Plan  No Known Allergies  Medications Reviewed Today     Reviewed by Edythe Clarity, [facility] (Pharmacist) on 08/31/21 at 1617  Med List Status: <None>   Medication Order Taking? Sig Documenting Provider Last Dose Status Informant  acetaminophen (TYLENOL) 500 MG tablet [id number]  Take 500 mg by mouth every 6 (six) hours as needed. [provider]  Active   alfuzosin (UROXATRAL) 10 MG 24 hr tablet [id number] No Take 10 mg by mouth daily.  Patient not taking: Reported on 08/31/2021   [provider] Not Taking Active   ALPRAZolam Duanne Moron) 0.5 MG tablet [id number]  TAKE 1 TABLET BY MOUTH TWICE DAILY AS NEEDED Vivi Barrack, MD  Active   atorvastatin (LIPITOR) 20 MG tablet [id number]  Take 1 tablet (20 mg total) by mouth daily. Vivi Barrack, MD  Active   B Complex Vitamins (B COMPLEX PO) [id number]  Take by mouth. [provider]  Active   citalopram (CELEXA) 20 MG tablet [id number]  TAKE 1 TABLET(20 MG) BY MOUTH DAILY Vivi Barrack, MD  Active   folic acid (FOLVITE) 1 MG tablet [id number]  Take 1 mg by mouth daily. [provider]  Active   ibuprofen (ADVIL) 200 MG tablet [id number]  Take 200 mg by mouth as needed. [provider]  Active   losartan (COZAAR) 50 MG tablet [id number]  Take 29m (half tablet) dail. PVivi Barrack MD  Active   Multiple Vitamin (MULTIVITAMIN PO) [id number] Take 1 tablet by mouth daily. [provider]  Active   Multiple Vitamins-Minerals (ZINC PO) [id number] Take 1 tablet by mouth daily. [provider]  Active   omeprazole (PRILOSEC) 20 MG capsule [id number] TAKE 1 CAPSULE(20 MG) BY MOUTH DAILY PVivi Barrack MD  Active     Discontinued 10/07/20 1330   SUPREP BOWEL PREP KIT 17.5-3.13-1.6 GM/177ML SOLN [id number] Take 1 kit by mouth as directed. For colonoscopy prep KNoralyn Pick NP  Active   VITAMIN D PO [id number] Take 800 Units by mouth.  [provider]  Active             Patient Active Problem List   Diagnosis Date Noted   Actinic keratosis 10/07/2020   Alcohol abuse 04/22/2020   BPH (benign prostatic hyperplasia) s/p TURP 2021 04/22/2020   Other retention of urine 02/10/2020   [place] 02/10/2020   Hypophosphatemia 02/09/2020   Hypokalemia 02/09/2020   Sinus tachycardia 02/08/2020   Hypomagnesemia 02/08/2020   Elevated lactic acid level 02/08/2020   Essential hypertension 06/29/2019   Dyslipidemia 06/29/2019   GERD (gastroesophageal reflux disease) 06/29/2019   Anxiety 06/29/2019   Lung cancer ([place] s/p resection in 2010 06/29/2019   Rectal cancer ([place]     Hypercholesterolemia 03/08/2012   Colon cancer metastasized to lung ([place] 05/24/2011   Vitamin B12 deficiency 09/30/2010   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    Immunization  History  Administered Date(s) Administered   Fluad Quad(high Dose 65+) 07/30/2019, 08/01/2020, 08/31/2021   Influenza, High Dose Seasonal PF 07/30/2019, 08/01/2020   Influenza,inj,Quad PF,6+ Mos 11/08/2011   Influenza-Unspecified 10/14/2012, 08/05/2013, 08/16/2017   PFIZER(Purple Top)SARS-COV-2 Vaccination 11/30/2019, 12/25/2019   Zoster, Live 12/21/2012, 08/05/2013    Conditions to be addressed/monitored:  HTN, GERD, HLD, Anxiety,  Care Plan : General Pharmacy (Adult)  Updates made by Edythe Clarity, RPH since 08/31/2021 12:00 AM     Problem: HTN, GERD, HLD, Anxiety,   Priority: High  Onset Date: 02/28/2022     Long-Range Goal: Patient-Specific Goal   Start Date: 08/31/2021  Expected End Date: 02/28/2022  This Visit's Progress: On track  Priority: High  Note:   Current Barriers:  Possible losartan related cough  Pharmacist Clinical Goal(s):  Patient will maintain control of BP as evidenced by monitoring  through collaboration with PharmD and provider.   Interventions: 1:1 collaboration with Vivi Barrack, MD regarding development and update of comprehensive plan of care as evidenced by provider attestation and co-signature Inter-disciplinary care team collaboration (see longitudinal plan of care) Comprehensive medication review performed; medication list updated in electronic medical record  Hypertension (BP goal <140/90) -Controlled -Current treatment: Losartan 15m one-half tablet daily -Medications previously tried: HCTZ 259mdaily  -Current home readings: not checking at home -Current dietary habits: tries to eat a lot of fish, cannot eat steak due to esophagus issues.  Trying to limit alcohol -Current exercise habits: plays a lot of golf -Denies  hypotensive/hypertensive symptoms -Educated on BP goals and benefits of medications for prevention of heart attack, stroke and kidney damage; Exercise goal of 150 minutes per week; Symptoms of hypotension and importance of maintaining adequate hydration; -Counseled to monitor BP at home as able, document, and provide log at future appointments -He mentions a long time dry cough that he has to use cough drops from [facility] for on a daily basis.  Discussed the potential that this could be related to losartan.  However, at this time since the cough is tolerable no changes recommended.  IF cough persists or worsens, we could look to d/c the medication as long as BP remains controlled.   -Recommended to continue current medication  Hyperlipidemia: (LDL goal < 100) -Controlled -Current treatment: Atorvastatin 2057maily -Medications previously tried: none noted  -Current dietary patterns: see above -Current exercise habits: see above -Educated on Cholesterol goals;  Benefits of statin for ASCVD risk reduction; Importance of limiting foods high in cholesterol; Does mention some cramping in legs and hands.  Most recent cholesterol in our office was 2020.  However did find updated one from late 2021 which continues to show control. -Recommended to continue current medication - monitor myalgias.   Could consider addition of CoQ10 for statin induced cramping. Also want to make sure vitamin D level is controlled as deficiency is linked to more statin adverse effects.  Depression/Anxiety (Goal: Minimize symptoms) -Controlled -Current treatment: Citalopram 52m74mily Alprazolam 0.5mg 44m prn as directed -Medications previously tried/failed: none noted -PHQ9:  PHQ9 SCORE ONLY 07/23/2021 03/26/2021 01/24/2020  PHQ-9 Total Score 0 0 0  Some encounter information is confidential and restricted. Go to Review Flowsheets activity to see all data.  -Educated on Benefits of medication for symptom  control Taking 2 weeks to full effect, do not stop abruptly contact providers as we may want to taper you off this medication. He does not feel like he is depressed at this time.  Sleep is good besides  occasional trips to the bathroom. -Recommended to continue current medication  GERD (Goal: Minimze symptoms) -Controlled -Current treatment  Omperazole 68m daily -Medications previously tried: none noted  -Counseled on appropriate administration 30-60 minutes before meals every morning. Does have symptoms when he does not take, reports history of hiatal hernia. Continue current medication for now - for best efficacy take daily.  Patient Goals/Self-Care Activities Patient will:  - take medications as prescribed as evidenced by patient report and record review focus on medication adherence by pill counts check blood pressure periodically, document, and provide at future appointments  Follow Up Plan: The care management team will reach out to the patient again over the next 180 days.         Medication Assistance: None required.  Patient affirms current coverage meets needs.  Compliance/Adherence/Medication fill history: Care Gaps: Pneumovax Colonoscopy  Star-Rating Drugs: LOSARTAN 50MG TABLETS 07/24/2021 90   ATORVASTATIN 20MG TABLETS 07/24/2021 90    [facility]  [facility] [address]Phone: [phone]: [phone] [facility] [address]Phone: [phone]: [phone] Uses pill box? No - prefers bottle Pt endorses 100% compliance  We discussed: Benefits of medication synchronization, packaging and delivery as well as enhanced pharmacist oversight with [employer]. Patient decided to:  Remain with  current pharmacy,. [place]  Care Plan and Follow Up Patient Decision:  Patient agrees to Care Plan and Follow-up.  Plan: The care management team will reach out to the patient again over the next 180 days.  CBeverly Milch PharmD Clinical Pharmacist  [facility]([phone]

## 2021-08-31 NOTE — Patient Instructions (Signed)
Visit Information   Goals Addressed   None    There are no care plans to display for this patient.   Jesus [name] was given information about Chronic Care Management services today including:  CCM service includes personalized support from designated clinical staff supervised by his physician, including individualized plan of care and coordination with other care providers 24/7 contact phone numbers for assistance for urgent and routine care needs. Standard insurance, coinsurance, copays and deductibles apply for chronic care management only during months in which we provide at least 20 minutes of these services. Most insurances cover these services at 100%, however patients may be responsible for any copay, coinsurance and/or deductible if applicable. This service may help you avoid the need for more expensive face-to-face services. Only one practitioner may furnish and bill the service in a calendar month. The patient may stop CCM services at any time (effective at the end of the month) by phone call to the office staff.  Patient agreed to services and verbal consent obtained.   The patient verbalized understanding of instructions, educational materials, and care plan provided today and agreed to receive a mailed copy of patient instructions, educational materials, and care plan.  Telephone follow up appointment with pharmacy team member scheduled for: 6 months  Edythe Clarity, [place]

## 2021-09-01 DIAGNOSIS — M25561 Pain in right knee: Secondary | ICD-10-CM | POA: Diagnosis not present

## 2021-09-01 DIAGNOSIS — Z96651 Presence of right artificial knee joint: Secondary | ICD-10-CM | POA: Diagnosis not present

## 2021-09-16 ENCOUNTER — Encounter: Payer: Self-pay | Admitting: Family

## 2021-09-16 ENCOUNTER — Ambulatory Visit (INDEPENDENT_AMBULATORY_CARE_PROVIDER_SITE_OTHER): Payer: Medicare Other | Admitting: Family

## 2021-09-16 ENCOUNTER — Other Ambulatory Visit: Payer: Self-pay

## 2021-09-16 VITALS — BP 146/66 | HR 63 | Temp 97.5°F | Ht 71.0 in | Wt 207.4 lb

## 2021-09-16 DIAGNOSIS — H1013 Acute atopic conjunctivitis, bilateral: Secondary | ICD-10-CM

## 2021-09-16 MED ORDER — OLOPATADINE HCL 0.2 % OP SOLN: OPHTHALMIC | 0 refills | Status: DC

## 2021-09-16 NOTE — Progress Notes (Signed)
Subjective:     Patient ID: Jesus [name], male    DOB: 12-20-1945, 75 y.o.   MRN: [id number]  Chief Complaint  Patient presents with   Conjunctivitis    Exposed to wife. He has had mild bilateral eye itching for 3 days now.   HPI: Eye irritation:  pt complains of itching, watery discharge, & redness. Symptoms started 2 days ago. Symptoms are located in both eyes. Pt has been exposed to someone with confirmed conjunctivitis. Pt does not also have other sinus symptoms.   Health Maintenance Due  Topic Date Due   Pneumonia Vaccine 77+ Years old (1 - PCV) Never done   Hepatitis C Screening  Never done   TETANUS/TDAP  Never done   Zoster Vaccines- Shingrix (1 of 2) Never done   COLONOSCOPY (Pts 45-95yr Insurance coverage will need to be confirmed)  Never done    Past Medical History:  Diagnosis Date   Anxiety    Arthritis    Bowel obstruction ([facility]    Cancer [place]    Colon cancer ([facility]    2000   Colon polyp    Depression    Elevated LFTs    Gallstones    GERD (gastroesophageal reflux disease)    Hyperlipidemia    Hypertension    [place]    [place]    Past Surgical History:  Procedure Laterality Date   ANKLE FUSION Left    crushed ankle while in high school   APPENDECTOMY     1960's   CHOLECYSTECTOMY     COLON SURGERY     with reanastomosis   PROSTATE SURGERY      Outpatient Medications Prior to Visit  Medication Sig Dispense Refill   acetaminophen (TYLENOL) 500 MG tablet Take 500 mg by mouth every 6 (six) hours as needed.     ALPRAZolam (XANAX) 0.5 MG tablet TAKE 1 TABLET BY MOUTH TWICE DAILY AS NEEDED 60 tablet 5   atorvastatin (LIPITOR) 20 MG tablet Take 1 tablet (20 mg total) by mouth daily. 90 tablet 3   B Complex Vitamins (B COMPLEX PO) Take by mouth.     citalopram (CELEXA) 20 MG tablet TAKE 1 TABLET(20 MG) BY MOUTH DAILY 30 tablet 2   folic acid (FOLVITE) 1 MG tablet Take 1 mg by mouth daily.     ibuprofen (ADVIL) 200 MG tablet Take 200  mg by mouth as needed.     losartan (COZAAR) 50 MG tablet Take 253m(half tablet) dail. 90 tablet 1   Multiple Vitamin (MULTIVITAMIN PO) Take 1 tablet by mouth daily.     Multiple Vitamins-Minerals (ZINC PO) Take 1 tablet by mouth daily.     omeprazole (PRILOSEC) 20 MG capsule TAKE 1 CAPSULE(20 MG) BY MOUTH DAILY 90 capsule 0   VITAMIN D PO Take 800 Units by mouth.      alfuzosin (UROXATRAL) 10 MG 24 hr tablet Take 10 mg by mouth daily. (Patient not taking: Reported on 08/31/2021)     SUPREP BOWEL PREP KIT 17.5-3.13-1.6 GM/177ML SOLN Take 1 kit by mouth as directed. For colonoscopy prep (Patient not taking: Reported on 09/16/2021) 354 mL 0   No facility-administered medications prior to visit.    No Known Allergies      Objective:    Physical Exam Vitals and nursing note reviewed.  Constitutional:      General: He is not in acute distress.    Appearance: Normal appearance.  HENT:     Head: Normocephalic.  Eyes:     General:        Right eye: Discharge (watery) present. No foreign body.        Left eye: Discharge (watery) present.No foreign body.     Conjunctiva/sclera:     Right eye: Right conjunctiva is injected (mild).     Left eye: Left conjunctiva is injected (mild).  Cardiovascular:     Rate and Rhythm: Normal rate and regular rhythm.  Pulmonary:     Effort: Pulmonary effort is normal.     Breath sounds: Normal breath sounds.  Musculoskeletal:        General: Normal range of motion.     Cervical back: Normal range of motion.  Skin:    General: Skin is warm and dry.  Neurological:     Mental Status: He is alert and oriented to person, place, and time.  Psychiatric:        Mood and Affect: Mood normal.   BP (!) 146/66   Pulse 63   Temp (!) 97.5 F (36.4 C) (Temporal)   Ht 5' 11"  (1.803 m)   Wt 207 lb 6.4 oz (94.1 kg)   SpO2 98%   BMI 28.93 kg/m  Wt Readings from Last 3 Encounters:  09/16/21 207 lb 6.4 oz (94.1 kg)  07/23/21 205 lb 12.8 oz (93.4 kg)   04/30/21 203 lb (92.1 kg)       Assessment & Plan:   Problem List Items Addressed This Visit   None Visit Diagnoses     Allergic conjunctivitis of both eyes    -  Primary   Relevant Medications   Olopatadine HCl (PATADAY) 0.2 % SOLN       Meds ordered this encounter  Medications   Olopatadine HCl (PATADAY) 0.2 % SOLN    Sig: Apply 1 drop to each eye once daily.    Dispense:  2.5 mL    Refill:  0    Order Specific Question:   Supervising Provider    Answer:   ANDY, CAMILLE L [[id number]]

## 2021-09-16 NOTE — Patient Instructions (Signed)
It was very nice to see you today!  I have sent antihistamine eye drops to your pharmacy. Start this today. Continue to wash/sanitize your hands and try not to rub eyes.  Happy Thanksgiving!   PLEASE NOTE:  If you had any lab tests please let us know if you have not heard back within a few days. You may see your results on MyChart before we have a chance to review them but we will give you a call once they are reviewed by [place]. If we ordered any referrals today, please let us know if you have not heard from their office within the next week.   Please try these tips to maintain a healthy lifestyle:  Eat most of your calories during the day when you are active. Eliminate processed foods including packaged sweets (pies, cakes, cookies), reduce intake of potatoes, white bread, white pasta, and white rice. Look for whole grain options, oat flour or almond flour.  Each meal should contain half fruits/vegetables, one quarter protein, and one quarter carbs (no bigger than a computer mouse).  Cut down on sweet beverages. This includes juice, soda, and sweet tea. Also watch fruit intake, though this is a healthier sweet option, it still contains natural sugar! Limit to 3 servings daily.  Drink at least 1 glass of water with each meal and aim for at least 8 glasses per day  Exercise at least 150 minutes every week.

## 2021-09-23 DIAGNOSIS — E7800 Pure hypercholesterolemia, unspecified: Secondary | ICD-10-CM | POA: Diagnosis not present

## 2021-09-23 DIAGNOSIS — I10 Essential (primary) hypertension: Secondary | ICD-10-CM

## 2021-09-23 DIAGNOSIS — E785 Hyperlipidemia, unspecified: Secondary | ICD-10-CM

## 2021-10-29 DIAGNOSIS — Z85038 Personal history of other malignant neoplasm of large intestine: Secondary | ICD-10-CM | POA: Diagnosis not present

## 2021-10-29 DIAGNOSIS — E876 Hypokalemia: Secondary | ICD-10-CM | POA: Diagnosis not present

## 2021-10-29 DIAGNOSIS — K8050 Calculus of bile duct without cholangitis or cholecystitis without obstruction: Secondary | ICD-10-CM | POA: Diagnosis not present

## 2021-10-29 DIAGNOSIS — Z0000 Encounter for general adult medical examination without abnormal findings: Secondary | ICD-10-CM | POA: Diagnosis not present

## 2021-10-29 DIAGNOSIS — Z85118 Personal history of other malignant neoplasm of bronchus and lung: Secondary | ICD-10-CM | POA: Diagnosis not present

## 2021-10-29 DIAGNOSIS — I10 Essential (primary) hypertension: Secondary | ICD-10-CM | POA: Diagnosis not present

## 2021-10-29 DIAGNOSIS — E7800 Pure hypercholesterolemia, unspecified: Secondary | ICD-10-CM | POA: Diagnosis not present

## 2021-10-29 DIAGNOSIS — Z23 Encounter for immunization: Secondary | ICD-10-CM | POA: Diagnosis not present

## 2021-10-30 DIAGNOSIS — Z0000 Encounter for general adult medical examination without abnormal findings: Secondary | ICD-10-CM | POA: Diagnosis not present

## 2021-10-30 DIAGNOSIS — E7800 Pure hypercholesterolemia, unspecified: Secondary | ICD-10-CM | POA: Diagnosis not present

## 2021-10-30 DIAGNOSIS — E876 Hypokalemia: Secondary | ICD-10-CM | POA: Diagnosis not present

## 2021-10-30 DIAGNOSIS — I10 Essential (primary) hypertension: Secondary | ICD-10-CM | POA: Diagnosis not present

## 2021-11-15 ENCOUNTER — Other Ambulatory Visit: Payer: Self-pay | Admitting: Family Medicine

## 2021-11-17 DIAGNOSIS — K5730 Diverticulosis of large intestine without perforation or abscess without bleeding: Secondary | ICD-10-CM | POA: Diagnosis not present

## 2021-11-17 DIAGNOSIS — Z980 Intestinal bypass and anastomosis status: Secondary | ICD-10-CM | POA: Diagnosis not present

## 2021-11-17 DIAGNOSIS — Z85048 Personal history of other malignant neoplasm of rectum, rectosigmoid junction, and anus: Secondary | ICD-10-CM | POA: Diagnosis not present

## 2021-11-17 DIAGNOSIS — I10 Essential (primary) hypertension: Secondary | ICD-10-CM | POA: Diagnosis not present

## 2021-11-17 DIAGNOSIS — Z1211 Encounter for screening for malignant neoplasm of colon: Secondary | ICD-10-CM | POA: Diagnosis not present

## 2021-11-17 DIAGNOSIS — K640 First degree hemorrhoids: Secondary | ICD-10-CM | POA: Diagnosis not present

## 2021-12-02 ENCOUNTER — Telehealth: Payer: Self-pay | Admitting: Pharmacist

## 2021-12-02 NOTE — Progress Notes (Signed)
Chronic Care Management Pharmacy Assistant   Name: Jesus [name]  MRN: [id number] DOB: Dec 25, 1945   Reason for Encounter: Hypertension Adherence Call    Recent office visits:  09/16/2021 Jesus Mood, NP; Olopatadine HCl 0.2% soln for Allergic conjunctivitis of both eyes  Recent consult visits:  None  Hospital visits:  None since last CPP visit  Medications: Outpatient Encounter Medications as of 12/02/2021  Medication Sig   acetaminophen (TYLENOL) 500 MG tablet Take 500 mg by mouth every 6 (six) hours as needed.   alfuzosin (UROXATRAL) 10 MG 24 hr tablet Take 10 mg by mouth daily. (Patient not taking: Reported on 08/31/2021)   ALPRAZolam (XANAX) 0.5 MG tablet TAKE 1 TABLET BY MOUTH TWICE DAILY AS NEEDED   atorvastatin (LIPITOR) 20 MG tablet Take 1 tablet (20 mg total) by mouth daily.   B Complex Vitamins (B COMPLEX PO) Take by mouth.   citalopram (CELEXA) 20 MG tablet TAKE 1 TABLET(20 MG) BY MOUTH DAILY   folic acid (FOLVITE) 1 MG tablet Take 1 mg by mouth daily.   ibuprofen (ADVIL) 200 MG tablet Take 200 mg by mouth as needed.   losartan (COZAAR) 50 MG tablet Take 17m (half tablet) dail.   Multiple Vitamin (MULTIVITAMIN PO) Take 1 tablet by mouth daily.   Multiple Vitamins-Minerals (ZINC PO) Take 1 tablet by mouth daily.   Olopatadine HCl (PATADAY) 0.2 % SOLN Apply 1 drop to each eye once daily.   omeprazole (PRILOSEC) 20 MG capsule TAKE 1 CAPSULE(20 MG) BY MOUTH DAILY   SUPREP BOWEL PREP KIT 17.5-3.13-1.6 GM/177ML SOLN Take 1 kit by mouth as directed. For colonoscopy prep (Patient not taking: Reported on 09/16/2021)   VITAMIN D PO Take 800 Units by mouth.    [DISCONTINUED] potassium chloride (KLOR-CON) 20 MEQ packet Take by mouth 2 (two) times daily.   No facility-administered encounter medications on file as of 12/02/2021.   Reviewed chart prior to disease state call. Spoke with patient regarding BP  Recent Office Vitals: BP Readings from Last 3  Encounters:  09/16/21 (!) 146/66  07/23/21 134/90  04/30/21 128/70   Pulse Readings from Last 3 Encounters:  09/16/21 63  07/23/21 85  04/30/21 68    Wt Readings from Last 3 Encounters:  09/16/21 207 lb 6.4 oz (94.1 kg)  07/23/21 205 lb 12.8 oz (93.4 kg)  04/30/21 203 lb (92.1 kg)     Kidney Function Lab Results  Component Value Date/Time   CREATININE 0.7 04/20/2021 12:00 AM   CREATININE 0.66 02/24/2021 08:42 AM   CREATININE 0.75 02/19/2021 09:12 AM   GFR 92.56 02/24/2021 08:42 AM   GFRNONAA 92 04/20/2021 12:00 AM   GFRAA 107 04/20/2021 12:00 AM    BMP Latest Ref Rng & Units 04/20/2021 02/24/2021 02/19/2021  Glucose 70 - 99 mg/dL - 104(H) 104(H)  BUN 4 - 21 9 6 6   Creatinine 0.6 - 1.3 0.7 0.66 0.75  Sodium 137 - 147 140 136 133(L)  Potassium 3.4 - 5.3 5.2 4.8 5.5(H)  Chloride 99 - 108 102 98 96  CO2 13 - 22 30(A) 31 30  Calcium 8.7 - 10.7 10.2 9.9 10.2    Current antihypertensive regimen:  Losartan 50 mg - 1/2 tablet daily  How often are you checking your Blood Pressure? infrequently  Current home BP readings: none to report - patient did not have any BP readings available to report.  What recent interventions/DTPs have been made by any provider to improve Blood Pressure control since last  CPP Visit: No recent interventions or DTPs.  Any recent hospitalizations or ED visits since last visit with CPP? No  What diet changes have been made to improve Blood Pressure Control?  Patient states he eats a healthy diet.  What exercise is being done to improve your Blood Pressure Control?  Patient likes to play golf.  Adherence Review: Is the patient currently on ACE/ARB medication? Yes Does the patient have >5 day gap between last estimated fill dates? No  Patient states he has had a dry cough for many years, he states he was taking Losartan 100 mg at one time. He has since decreased said medication to 25 mg. He states since decreasing the medication his dry cough has  improved but is still present.   Care Gaps: Medicare Annual Wellness: Completed 03/2021 Hemoglobin A1C: 5.3% on 04/24/2021 Colonoscopy: Completed 10/2021 -per patient. Patient states he recently had a colonoscopy in [place] in January of this year.  Future Appointments  Date Time Provider [place]  03/08/2022  2:15 PM [facility] CCM PHARMACIST [facility]  04/01/2022  3:15 PM [facility] HEALTH COACH [facility]    Star Rating Drugs: Atorvastatin 20 mg last filled 10/27/2021 90 DS Losartan 50 mg last filled 07/24/2021 90 DS  Jesus [name], [place] Pharmacist Assistant [phone]

## 2021-12-10 DIAGNOSIS — Z85118 Personal history of other malignant neoplasm of bronchus and lung: Secondary | ICD-10-CM | POA: Diagnosis not present

## 2021-12-10 DIAGNOSIS — I10 Essential (primary) hypertension: Secondary | ICD-10-CM | POA: Diagnosis not present

## 2021-12-10 DIAGNOSIS — E7800 Pure hypercholesterolemia, unspecified: Secondary | ICD-10-CM | POA: Diagnosis not present

## 2021-12-10 DIAGNOSIS — E876 Hypokalemia: Secondary | ICD-10-CM | POA: Diagnosis not present

## 2021-12-14 ENCOUNTER — Telehealth: Payer: Self-pay | Admitting: Family Medicine

## 2021-12-14 ENCOUNTER — Encounter (HOSPITAL_COMMUNITY): Payer: Self-pay | Admitting: Emergency Medicine

## 2021-12-14 ENCOUNTER — Emergency Department (HOSPITAL_COMMUNITY)
Admission: EM | Admit: 2021-12-14 | Discharge: 2021-12-14 | Disposition: A | Discharge status: Home / Self Care | Payer: Medicare Other | Attending: Emergency Medicine | Admitting: Emergency Medicine

## 2021-12-14 DIAGNOSIS — S90412A Abrasion, left great toe, initial encounter: Secondary | ICD-10-CM | POA: Diagnosis not present

## 2021-12-14 DIAGNOSIS — Z87891 Personal history of nicotine dependence: Secondary | ICD-10-CM | POA: Diagnosis not present

## 2021-12-14 DIAGNOSIS — W182XXA Fall in (into) shower or empty bathtub, initial encounter: Secondary | ICD-10-CM | POA: Diagnosis not present

## 2021-12-14 DIAGNOSIS — I10 Essential (primary) hypertension: Secondary | ICD-10-CM | POA: Diagnosis not present

## 2021-12-14 DIAGNOSIS — I498 Other specified cardiac arrhythmias: Secondary | ICD-10-CM

## 2021-12-14 DIAGNOSIS — Z85038 Personal history of other malignant neoplasm of large intestine: Secondary | ICD-10-CM | POA: Insufficient documentation

## 2021-12-14 DIAGNOSIS — Z85118 Personal history of other malignant neoplasm of bronchus and lung: Secondary | ICD-10-CM | POA: Diagnosis not present

## 2021-12-14 DIAGNOSIS — S99922A Unspecified injury of left foot, initial encounter: Secondary | ICD-10-CM | POA: Diagnosis present

## 2021-12-14 DIAGNOSIS — R42 Dizziness and giddiness: Secondary | ICD-10-CM

## 2021-12-14 DIAGNOSIS — R008 Other abnormalities of heart beat: Secondary | ICD-10-CM | POA: Diagnosis not present

## 2021-12-14 DIAGNOSIS — R197 Diarrhea, unspecified: Secondary | ICD-10-CM | POA: Diagnosis not present

## 2021-12-14 DIAGNOSIS — Z20822 Contact with and (suspected) exposure to covid-19: Secondary | ICD-10-CM | POA: Insufficient documentation

## 2021-12-14 DIAGNOSIS — W19XXXA Unspecified fall, initial encounter: Secondary | ICD-10-CM

## 2021-12-14 DIAGNOSIS — Z79899 Other long term (current) drug therapy: Secondary | ICD-10-CM | POA: Diagnosis not present

## 2021-12-14 LAB — HEPATIC FUNCTION PANEL
ALT: 25 U/L (ref 0–44)
AST: 33 U/L (ref 15–41)
Albumin: 4.2 g/dL (ref 3.5–5.0)
Alkaline Phosphatase: 58 U/L (ref 38–126)
Bilirubin, Direct: 0.2 mg/dL (ref 0.0–0.2)
Indirect Bilirubin: 1 mg/dL — ABNORMAL HIGH (ref 0.3–0.9)
Total Bilirubin: 1.2 mg/dL (ref 0.3–1.2)
Total Protein: 7.1 g/dL (ref 6.5–8.1)

## 2021-12-14 LAB — BASIC METABOLIC PANEL
Anion gap: 12 (ref 5–15)
BUN: 14 mg/dL (ref 8–23)
CO2: 27 mmol/L (ref 22–32)
Calcium: 9.5 mg/dL (ref 8.9–10.3)
Chloride: 98 mmol/L (ref 98–111)
Creatinine, Ser: 1.03 mg/dL (ref 0.61–1.24)
GFR, Estimated: >60 mL/min (ref >60)
Glucose, Bld: 144 mg/dL — ABNORMAL HIGH (ref 70–99)
Potassium: 5.1 mmol/L (ref 3.5–5.1)
Sodium: 137 mmol/L (ref 135–145)

## 2021-12-14 LAB — URINALYSIS, ROUTINE W REFLEX MICROSCOPIC
Bilirubin Urine: NEGATIVE
Glucose, UA: NEGATIVE mg/dL
Hgb urine dipstick: NEGATIVE
Ketones, ur: 20 mg/dL — AB
Leukocytes,Ua: NEGATIVE
Nitrite: NEGATIVE
Protein, ur: NEGATIVE mg/dL
Specific Gravity, Urine: 1.012 (ref 1.005–1.030)
pH: 5 (ref 5.0–8.0)

## 2021-12-14 LAB — RESP PANEL BY RT-PCR (FLU A&B, COVID) ARPGX2
Influenza A by PCR: NEGATIVE
Influenza B by PCR: NEGATIVE
SARS Coronavirus 2 by RT PCR: NEGATIVE

## 2021-12-14 LAB — CBC
HCT: 48.2 % (ref 39.0–52.0)
Hemoglobin: 16.2 g/dL (ref 13.0–17.0)
MCH: 32.2 pg (ref 26.0–34.0)
MCHC: 33.6 g/dL (ref 30.0–36.0)
MCV: 95.8 fL (ref 80.0–100.0)
Platelets: 175 10*3/uL (ref 150–400)
RBC: 5.03 MIL/uL (ref 4.22–5.81)
RDW: 14.5 % (ref 11.5–15.5)
WBC: 9.9 10*3/uL (ref 4.0–10.5)
nRBC: 0 % (ref 0.0–0.2)

## 2021-12-14 LAB — MAGNESIUM: Magnesium: 1.9 mg/dL (ref 1.7–2.4)

## 2021-12-14 LAB — ETHANOL: Alcohol, Ethyl (B): <10 mg/dL (ref <10)

## 2021-12-14 NOTE — ED Triage Notes (Signed)
Patient here for evaluation of intermittent dizziness and multiple falls starting yesterday. Patient denies hitting his head, patient complains of wrist pain.

## 2021-12-14 NOTE — ED Notes (Signed)
Pt reports extensive hx of ETOH abuse with last drink last night, states he averages 6 mixed drinks a night and the ETOH has been a factor in his recent falls. During assessment, RN noted that pt was intermittently in bigeminy with pauses and tried to capture on EKG. Gave EKGs to Dr Billy Fischer and reported ETOH use. Add on lipase to previous labs. Covid swab and urine sent.

## 2021-12-14 NOTE — ED Provider Triage Note (Signed)
Emergency Medicine Provider Triage Evaluation Note  Jesus [name] , a 76 y.o. male  was evaluated in triage.  Pt complains of dizziness and 2 falls since Friday.  Patient states that he will have acute onset of feeling like the room is spinning.  He fell once in the shower on Friday, and fell in his bedroom last night.  He denies head trauma both times.  States that the symptoms last for only several seconds, before they resolve on their own.  No aggravating or alleviating factors.  While he is having these episodes he denies any pain.  Review of Systems  Positive: Dizziness, falls Negative: Head trauma, syncope, chest pain, shortness of breath, palpitations, vision changes  Physical Exam  BP 94/65 (BP Location: Right Arm)    Pulse 95    Temp 98.1 F (36.7 C) (Oral)    Resp 16    SpO2 96%  Gen:   Awake, no distress   Resp:  Normal effort  MSK:   Moves extremities without difficulty  Other:    Medical Decision Making  Medically screening exam initiated at 2:46 PM.  Appropriate orders placed.  [name] was informed that the remainder of the evaluation will be completed by another provider, this initial triage assessment does not replace that evaluation, and the importance of remaining in the ED until their evaluation is complete.     Kateri Plummer, PA-C 12/14/21 1446

## 2021-12-14 NOTE — Telephone Encounter (Signed)
**Note Jesus-Identified via Obfuscation** Pt is currently at the [facility]  Patient Name: Jesus [name] Gender: Male DOB: Aug 10, 1946 Age: 76 Y 20 D Return Phone Number: [phone] (Primary), [phone] (Secondary) Address: City/ State/ Zip: [place]  [address] Client [place] [facility] [place] [facility] [place] [facility] Provider Dimas Chyle- MD Contact Type Call Who Is Calling Patient / Member / Family / Caregiver Call Type Triage / Clinical Relationship To Patient Self Return Phone Number [phone] (Primary) Chief Complaint FAINTING or [place] Reason for Call Symptomatic / Request for [place] states that he is dizzy and he fell this morning.He almost fainted. Translation No Nurse Assessment Nurse: Ysidro Evert, RN, Levada Dy Date/Time Eilene Ghazi Time): 12/14/2021 1:22:21 PM Confirm and document reason for call. If symptomatic, describe symptoms. ---Caller states he is having dizziness that started this morning. He fell twice yesterday Does the patient have any new or worsening symptoms? ---Yes Will a triage be completed? ---Yes Related visit to physician within the last 2 weeks? ---No Does the PT have any chronic conditions? (i.e. diabetes, asthma, this includes High risk factors for pregnancy, etc.) ---No Is this a behavioral health or substance abuse call? ---No Guidelines Guideline Title Affirmed Question Affirmed Notes Nurse Date/Time (Eastern Time) Falls and Falling [1] Unable to get up until help (e.g., caregiver, family, friend) arrived AND [2] on the ground 1 hour or more Ysidro Evert, Environmental consultant 12/14/2021 1:23:45 PM Disp. Time Eilene Ghazi Time) Disposition Final User 12/14/2021 1:18:05 PM Send to Urgent Queue Windy Canny 12/14/2021 1:29:17 PM Go to ED Now (or PCP triage) Yes Ysidro Evert, RN, Marin Shutter Disagree/Comply Comply Caller Understands Yes PreDisposition Did not know what to do Care Advice Given  Per Guideline GO TO ED NOW (OR PCP TRIAGE): * IF NO PCP (PRIMARY CARE PROVIDER) SECOND-LEVEL TRIAGE: You need to be seen within the next hour. Go to the [facility] at _____________ [place] as soon as you can. CARE ADVICE given per Fall and Falling (Adult) guideline. Referrals [facility] - ED

## 2021-12-14 NOTE — Telephone Encounter (Signed)
Pt states he has fallen today and is experiencing dizziness. He states he keeps feeling off balance. Sent to triage.

## 2021-12-14 NOTE — Telephone Encounter (Signed)
Patient at ED now

## 2021-12-14 NOTE — ED Provider Notes (Signed)
[name] EMERGENCY DEPARTMENT Provider Note  History   Chief Complaint  Patient presents with   Dizziness   The history is provided by the patient and the spouse.  Illness Location:  Head Quality:  Sensation of room spinning Severity:  Moderate Onset quality:  Unable to specify Duration:  1 minute Timing:  Unable to specify Progression:  Resolved Chronicity:  New Context:  See MDM Associated symptoms: diarrhea ((Chronic diarrhea))   Associated symptoms: no abdominal pain, no chest pain, no congestion, no cough, no fever, no headaches, no nausea, no rash, no shortness of breath and no sore throat     Past Medical History:  Diagnosis Date   Anxiety    Arthritis    Bowel obstruction (HCC)    Cancer (H[facility])    Colon cancer ([place])    2000   Colon polyp    Depression    Elevated LFTs    Gallstones    GERD (gastroesophageal reflux disease)    Hyperlipidemia    Hypertension    Lung cancer ([place])    2010    Social History   Tobacco Use   Smoking status: Former    Types: Cigarettes    Quit date: 2010    Years since quitting: 13.1   Smokeless tobacco: Never  Substance Use Topics   Alcohol use: Not Currently    Comment: as of 04/30/21, 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 Previously heavy user   Drug use: Never     Family History  Problem Relation Age of Onset   Heart disease Father    Breast cancer Sister    Stroke Brother    Breast cancer Daughter    Prostate cancer Neg Hx    Colon cancer Neg Hx    Stomach cancer Neg Hx    Pancreatic cancer Neg Hx    Esophageal cancer Neg Hx    Liver disease Neg Hx     Review of Systems  Constitutional:  Negative for chills and fever.  HENT:  Negative for congestion and sore throat.   Eyes:  Negative for photophobia and visual disturbance.  Respiratory:  Negative for cough and shortness of breath.   Cardiovascular:  Negative for chest pain and palpitations.  Gastrointestinal:  Positive for diarrhea ((Chronic  diarrhea)). Negative for abdominal pain, blood in stool and nausea.  Endocrine: Negative.   Genitourinary:  Negative for dysuria and hematuria.  Musculoskeletal:  Negative for neck pain and neck stiffness.  Skin:  Negative for rash and wound.  Allergic/Immunologic: Negative.   Neurological:  Positive for dizziness ((Resolved now)). Negative for seizures, syncope, facial asymmetry, speech difficulty, weakness, light-headedness, numbness and headaches.  Hematological: Negative.   Psychiatric/Behavioral: Negative.      Physical Exam   Today's Vitals   12/14/21 1930 12/14/21 2000 12/14/21 2105 12/14/21 2110  BP: (!) 165/95 (!) 164/97 (!) 171/92   Pulse: 79 91 76   Resp: 20 (!) 21 (!) 21   Temp:    98 F (36.7 C)  TempSrc:      SpO2: 99% 99% 99%   PainSc:    1      Physical Exam Vitals and nursing note reviewed.  Constitutional:      General: He is not in acute distress.    Appearance: Normal appearance. He is well-developed and normal weight. He is not ill-appearing or toxic-appearing.  HENT:     Head: Normocephalic and atraumatic.     Nose: Nose normal. No congestion.  Mouth/Throat:     Mouth: Mucous membranes are moist.     Pharynx: Oropharynx is clear.  Eyes:     Extraocular Movements: Extraocular movements intact.     Conjunctiva/sclera: Conjunctivae normal.     Pupils: Pupils are equal, round, and reactive to light.  Cardiovascular:     Rate and Rhythm: Regular rhythm.     Pulses: Normal pulses.     Heart sounds: Normal heart sounds. No murmur heard. Pulmonary:     Effort: Pulmonary effort is normal. No respiratory distress.     Breath sounds: Normal breath sounds. No stridor. No wheezing or rales.  Abdominal:     General: There is no distension.     Palpations: Abdomen is soft.     Tenderness: There is no abdominal tenderness. There is no right CVA tenderness, left CVA tenderness, guarding or rebound.  Musculoskeletal:        General: Tenderness (TTP along  anterior right wrist, ROM intact, no swelling, neurovascularly intact) present. No swelling.     Cervical back: Normal range of motion and neck supple. No tenderness.     Right lower leg: No edema.     Left lower leg: No edema.     Comments: Abrasion along anterior aspect of left great toe, no TTP or swelling or deformity  Skin:    General: Skin is warm and dry.     Capillary Refill: Capillary refill takes less than 2 seconds.  Neurological:     General: No focal deficit present.     Mental Status: He is alert and oriented to person, place, and time. Mental status is at baseline.     Cranial Nerves: No cranial nerve deficit.     Sensory: No sensory deficit.     Motor: No weakness.     Coordination: Coordination normal.  Psychiatric:        Mood and Affect: Mood normal.        Behavior: Behavior normal.    ED Course  Procedures  Medical Decision Making:  Jesus [name] is a 76 y.o. male w/ h/o HTN, lung cancer, colon cancer, GERD, BPH, dyslipidemia, anxiety who p/w c/f episode of dizziness.   Patient has no history of vertigo or dizziness Denies new medications Endorses compliance with home medication Denies recreational drug use other than heavy EtOH consumption  Briefly, patient experienced 2 falls yesterday and 1 minute of dizziness this morning which resolved on its own and has not recurred.  2/19 AM: Patient tripped while getting out of the shower. No dizziness or syncope. Refused EtOH on board. Assisted self off floor using grab-bars in bathroom. Abrasion on R great toe, bandaged by wife.   2/19 PM: Wife states the patient was highly intoxicated so she brought him dinner in bed. He was eating on the side of the bed when his napkin dropped, he leaned over to grab it, lost his balance and fell on the ground. Denies hitting head. No LOC. No sz-like activity. No dizziness or syncope. Wife assisted back into bed. R wrist pain since this time.    2/20 AM: At noon patient had  sudden onset dizziness (room spinning) x 1 min while ambulating in the kitchen. He sat down and it resolved. This sensation has not recurred. No associated sxs. No syncope. No fall. Denies EtOH this AM.  Patient subsequently called his PCP RN on call who instructed him to present to ED for further evaluation.  On examination, patient is alert/oriented x4, cranial nerves  intact, no focal weakness, no sensory deficit, coordination intact, normal gait.  No focal neurologic deficit.  He denies dizziness on examination and states it has not hurt since this morning when it resolved after 1 minute as above.  The patient states he has been eating regularly leading up to this event, denies emesis, and reports baseline diarrhea after his bowel resection with earlier colon cancer.  Patient had bigeminy on EKG, which resolved in the ED.  Will obtain basic labs to evaluate for electrolyte abnormality.  Will order XR to evaluate right wrist pain after fall.  Will obtain CT head and CT cervical spine in the setting of elderly male fall, followed by dizziness.  Patient denies any complaint at this time.  No chest pain, no shortness of breath, no nausea/vomiting, no new diarrhea, no blood in stool, no abdominal pain, no syncope, no lightheadedness, and no persistent dizziness.  ER provider interpretation of Imaging / Radiology:  Ordered XR right wrist, CT head, CT cervical spine.  However patient left prior to obtaining these studies.  ER provider interpretation of EKG:  Rhythm difficult to interpret: Appears sinus with frequent PACs, PVCs.  No ST elevation or reciprocal changes.  ER provider interpretation of Labs:  CBC without leukocytosis or acute anemia BMP without emergent electrolyte abnormalities or AKI, of note BG 144 Magnesium 1.9 Hepatic function panel: No elevated AST/ALT UA without UTI or blood COVID/flu negative Ethanol <10  Key medications administered in the ER:  Medications - No data to  display  Diagnoses considered: Etiology most consistent with peripheral vertigo. Differential diagnoses includes: BPPV vs labrynthitis.No red flag features for central vertigo to include gradual onset, vertical/bidirectional or nonfatigable nystagmus, focal neurologic findings on exam (including inability to ambulate). No c/f SAH, hemorrhagic stroke, or VA dissection (as no associated HA/neck pain). No c/f ACS or PE (as no CP/SOB). No c/f arrhythmia (as no associated palpitations). No c/f stroke or ramsay-hunt syndrome (as no associated focal neurologic deficits). Not thought to be an acute CNS infection, temporal lobe epilepsy, MS, or trauma. Other acute, emergent causes of vertigo are unlikely given at this time. Not c/w pre-syncope / lightheadedness as not worse with upright position/walking. No indication for head imaging at this time.  Consulted: None  Patient refused CT scans and requested d/c home.  Given cardiology information for outpatient follow-up given his bigeminy here.  Patient discharged in stable condition.  Patient seen in conjunction with Dr. Inez Catalina medical dictation software was used in the creation of this note.   Electronically signed by: Wynetta Fines, MD on 12/17/2021 at 2:04 PM  Clinical Impression:  1. Fall, initial encounter   2. Dizziness   3. Ventricular bigeminy     Dispo: Discharge    Wynetta Fines, MD 12/17/21 1411    Gareth Morgan, MD 12/17/21 2217

## 2021-12-15 ENCOUNTER — Other Ambulatory Visit: Payer: Self-pay

## 2021-12-15 ENCOUNTER — Ambulatory Visit (INDEPENDENT_AMBULATORY_CARE_PROVIDER_SITE_OTHER): Payer: Medicare Other | Admitting: Family Medicine

## 2021-12-15 ENCOUNTER — Encounter: Payer: Self-pay | Admitting: Family Medicine

## 2021-12-15 VITALS — BP 121/76 | HR 83 | Temp 98.0°F | Ht 71.0 in | Wt 203.8 lb

## 2021-12-15 DIAGNOSIS — I10 Essential (primary) hypertension: Secondary | ICD-10-CM

## 2021-12-15 DIAGNOSIS — W19XXXD Unspecified fall, subsequent encounter: Secondary | ICD-10-CM | POA: Diagnosis not present

## 2021-12-15 DIAGNOSIS — I498 Other specified cardiac arrhythmias: Secondary | ICD-10-CM | POA: Diagnosis not present

## 2021-12-15 DIAGNOSIS — F1010 Alcohol abuse, uncomplicated: Secondary | ICD-10-CM

## 2021-12-15 DIAGNOSIS — C3412 Malignant neoplasm of upper lobe, left bronchus or lung: Secondary | ICD-10-CM

## 2021-12-15 DIAGNOSIS — R42 Dizziness and giddiness: Secondary | ICD-10-CM

## 2021-12-15 NOTE — Assessment & Plan Note (Signed)
At goal on losartan 25 mg daily.

## 2021-12-15 NOTE — Patient Instructions (Addendum)
It was very nice to see you today!  Please continue to cut down on drinking.  I will refer you to see the cardiologist.  I will also refer you to see a vestibular rehab for the vertigo and a physical therapist to help with your gait and falls.  We will get a CT scan of your lungs.  Please let us know if you have any change in symptoms.  Take care, Dr [name]  PLEASE NOTE:  If you had any lab tests please let us know if you have not heard back within a few days. You may see your results on mychart before we have a chance to review them but we will give you a call once they are reviewed by [place]. If we ordered any referrals today, please let us know if you have not heard from their office within the next week.   Please try these tips to maintain a healthy lifestyle:  Eat at least 3 REAL meals and 1-2 snacks per day.  Aim for no more than 5 hours between eating.  If you eat breakfast, please do so within one hour of getting up.   Each meal should contain half fruits/vegetables, one quarter protein, and one quarter carbs (no bigger than a computer mouse)  Cut down on sweet beverages. This includes juice, soda, and sweet tea.   Drink at least 1 glass of water with each meal and aim for at least 8 glasses per day  Exercise at least 150 minutes every week.

## 2021-12-15 NOTE — Progress Notes (Signed)
Jesus [name] is a 76 y.o. male who presents today for an office visit.  Assessment/Plan:  New/Acute Problems: Fall No red flags.  His 2 most recent falls seem to be mechanical in nature and likely due to alcohol use.  Did not have any syncope.  He has a reassuring cardiac and neurologic exam today.  We discussed the importance of reduction of alcohol intake.  He will continue to work on this.  We will place referral to PT to work on gait training and ways to prevent falls in the future.  He may have some vertigo as well and we will place referral for him to see a vestibular rehab specialist.  Symptoms do not seem to be cardiac in nature though did have some bigeminy on his cardiac monitoring in the emergency room.  Will place referral to cardiology per patient request.  Vertigo Potentially contributing to the fall.  We will place referral to vestibular rehab as above.  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  This also could be due to scar tissue.  He does not remember the last time he had a CT scan.  This was last done over 2 years ago according to our EMR.  We will repeat CT scan given his history to rule out malignancy recurrence.  Chronic Problems Addressed Today: Lung cancer ([place]) s/p resection in 2010 Still has ongoing cough that is worsened over the last few months.  He has had multiple procedures done in the past due to lung cancer with resection and metastasis.  He has seen pulmonology in the past but this was last done a few years ago.  He does not remember if he was told to follow-up or not.Marland Kitchen  He does not remember his last CT scan.  We order repeat CT scan to rule out any recurrence of the malignancy.  Alcohol abuse Encourage cessation.  Essential hypertension At goal on losartan 25 mg daily.     Subjective:  HPI:  Patient here for ED follow up. He  presented to the ED on 12/14/2021 with intermittent dizziness and 2 falls.  This initial fall was when he was in the shower trying to get out he slipped and fell on the floor.  Later on he was sitting in his bed and reached to pick up something off the floor when he fell again.   He did hurt his elbow. Did not lose consciousness. . To the emergency room for further evaluation.  Work-up there was essentially negative.  Had EKG in the ED which showed bigeminy. Had labs which was unremarkable. He was prescribed Meclizine in the ED. He was then discharged home to follow up with a cardiologist for further evaluation.  He did have an episode about 20 minutes of room spinning after his second fall.   He has been doing well since being home. He is here with her wife today. He is still having some dizziness. He notes he has slept well last night. He notes he had drink alcohol night before this happened and thinks this could be contributing. He never had anything like this before. He had one episode of fall several years ago. No precipitating events. His wife notes he has been shuffling when he walks. He would like to see PT for this issue. No head trauma or injury.   He also has a nagging cough.  This has been  going on for several months.  No specific treatments tried.  He is concerned due to his history of colon cancer in the past.       Objective:  Physical Exam: BP 121/76 (BP Location: Left Arm)    Pulse 83    Temp 98 F (36.7 C) (Temporal)    Ht 5\' 11"  (1.803 m)    Wt 203 lb 12.8 oz (92.4 kg)    SpO2 96%    BMI 28.42 kg/m   Gen: No acute distress, resting comfortably HEENT: TMs clear. CV: Regular rate and rhythm with no murmurs appreciated Pulm: Normal work of breathing, clear to auscultation bilaterally with no crackles, wheezes, or rhonchi Neuro: CN 2-12 intact.  Finger-nose-finger testing intact bilaterally.  Strength 5 out of 5 throughout. Psych: Normal affect and thought content       I,Savera Zaman,acting as a scribe for Dimas Chyle, MD.,have documented all relevant documentation on the behalf of Dimas Chyle, MD,as directed by  Dimas Chyle, MD while in the presence of Dimas Chyle, MD.   I, Dimas Chyle, MD, have reviewed all documentation for this visit. The documentation on 12/15/21 for the exam, diagnosis, procedures, and orders are all accurate and complete.  Time Spent: 45 minutes of total time was spent on the date of the encounter performing the following actions: chart review prior to seeing the patient including recent ED visit, obtaining history, performing a medically necessary exam, counseling on the treatment plan, placing orders, and documenting in our EHR.   Algis Greenhouse. [name], MD 12/15/2021 2:43 PM

## 2021-12-15 NOTE — Assessment & Plan Note (Signed)
Still has ongoing cough that is worsened over the last few months.  He has had multiple procedures done in the past due to lung cancer with resection and metastasis.  He has seen pulmonology in the past but this was last done a few years ago.  He does not remember if he was told to follow-up or not.Marland Kitchen  He does not remember his last CT scan.  We order repeat CT scan to rule out any recurrence of the malignancy.

## 2021-12-15 NOTE — Assessment & Plan Note (Signed)
Encourage cessation.

## 2021-12-16 ENCOUNTER — Ambulatory Visit (INDEPENDENT_AMBULATORY_CARE_PROVIDER_SITE_OTHER): Payer: Medicare Other

## 2021-12-16 ENCOUNTER — Other Ambulatory Visit: Payer: Self-pay

## 2021-12-16 ENCOUNTER — Encounter: Payer: Self-pay | Admitting: Physician Assistant

## 2021-12-16 ENCOUNTER — Ambulatory Visit: Payer: Medicare Other | Admitting: Physician Assistant

## 2021-12-16 VITALS — BP 134/80 | HR 73 | Ht 71.0 in | Wt 198.8 lb

## 2021-12-16 DIAGNOSIS — F1010 Alcohol abuse, uncomplicated: Secondary | ICD-10-CM

## 2021-12-16 DIAGNOSIS — Z85118 Personal history of other malignant neoplasm of bronchus and lung: Secondary | ICD-10-CM

## 2021-12-16 DIAGNOSIS — C7800 Secondary malignant neoplasm of unspecified lung: Secondary | ICD-10-CM

## 2021-12-16 DIAGNOSIS — C189 Malignant neoplasm of colon, unspecified: Secondary | ICD-10-CM | POA: Diagnosis not present

## 2021-12-16 DIAGNOSIS — E785 Hyperlipidemia, unspecified: Secondary | ICD-10-CM | POA: Diagnosis not present

## 2021-12-16 DIAGNOSIS — I498 Other specified cardiac arrhythmias: Secondary | ICD-10-CM | POA: Diagnosis not present

## 2021-12-16 DIAGNOSIS — R42 Dizziness and giddiness: Secondary | ICD-10-CM

## 2021-12-16 DIAGNOSIS — I10 Essential (primary) hypertension: Secondary | ICD-10-CM

## 2021-12-16 NOTE — Progress Notes (Signed)
Cardiology Office Note    Date:  12/16/2021   ID:  Jesus [name], Jesus [name] 06-08-1946, MRN [id number]   PCP:  Vivi Barrack, MD   [place]  Cardiologist:  None   Advanced Practice Provider:  No care team member to display Electrophysiologist:  None   [id number]   Chief Complaint  Patient presents with   Dizziness    History of Present Illness:  Jesus [name] is a 76 y.o. male with history of HTN, family hx CAD F died MI 39, brother died CVA 28, HLD, ETOH, lung CA s/p resection.  Patient comes in because of dizziness and falls.Fell twice on Sunday and again Monday. Sunday he had 4-5 drinks and was intoxicated. Slipped coming out of shower. Later, still intoxicated He bent over to pick up a napkin while sitting on bed and fell off and brother had to pick him up. Monday not drinking dizzy just sitting for 10-15 min-room was spinning.. ER told him he may have vertigo. He refused CT and left. EKG's show NSR with PAC's. Drinks vodka and tonic, wine, some nonalcoholic beer. Used to drink a lot more, trying to cut back.  Labs stable K 5.1 glucose 144, Crt 1.03. has a apple watch and HR 70's.  Denies chest pain, palpitations, dyspnea. Used to golf a lot but not in past month. Walks 2 miles daily, swims in summer. Spends half the year in [place].     Past Medical History:  Diagnosis Date   Anxiety    Arthritis    Bowel obstruction (HCC)    Cancer (HCC)    Colon cancer (HCC)    2000   Colon polyp    Depression    Elevated LFTs    Gallstones    GERD (gastroesophageal reflux disease)    Hyperlipidemia    Hypertension    Lung cancer (HCC)    2010    Past Surgical History:  Procedure Laterality Date   ANKLE FUSION Left    crushed ankle while in high school   APPENDECTOMY     20 60's   CHOLECYSTECTOMY     COLON SURGERY     with reanastomosis   PROSTATE SURGERY      Current Medications: Current Meds  Medication Sig   acetaminophen  (TYLENOL) 500 MG tablet Take 500 mg by mouth every 6 (six) hours as needed.   ALPRAZolam (XANAX) 0.5 MG tablet TAKE 1 TABLET BY MOUTH TWICE DAILY AS NEEDED   atorvastatin (LIPITOR) 20 MG tablet Take 1 tablet (20 mg total) by mouth daily.   B Complex Vitamins (B COMPLEX PO) Take by mouth.   citalopram (CELEXA) 20 MG tablet TAKE 1 TABLET(20 MG) BY MOUTH DAILY   ibuprofen (ADVIL) 200 MG tablet Take 200 mg by mouth as needed.   losartan (COZAAR) 50 MG tablet Take 31m (half tablet) dail.   Multiple Vitamin (MULTIVITAMIN PO) Take 1 tablet by mouth daily.   Multiple Vitamins-Minerals (ZINC PO) Take 1 tablet by mouth daily.   Olopatadine HCl (PATADAY) 0.2 % SOLN Apply 1 drop to each eye once daily.   omeprazole (PRILOSEC) 20 MG capsule TAKE 1 CAPSULE(20 MG) BY MOUTH DAILY   SUPREP BOWEL PREP KIT 17.5-3.13-1.6 GM/177ML SOLN Take 1 kit by mouth as directed. For colonoscopy prep   VITAMIN D PO Take 800 Units by mouth.      Allergies:   Patient has no known allergies.   Social History   Socioeconomic History   Marital  status: Married    Spouse name: Not on file   Number of children: 1   Years of education: Not on file   Highest education level: Some college, no degree  Occupational History   Occupation: Retired   Tobacco Use   Smoking status: Former    Types: Cigarettes    Quit date: 2010    Years since quitting: 13.1   Smokeless tobacco: Never  Substance and Sexual Activity   Alcohol use: Not Currently    Comment: as of 04/30/21, 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 Previously heavy user   Drug use: Never   Sexual activity: Not Currently  Other Topics Concern   Not on file  Social History Narrative    Retired. Lives part of the time in [place] and is followed by the [facility] there. Lives with spouse. Like to play golf and stay active. Playing golf twice a week. Has begun to swim everyday. No ETOH in 3 months. Relationship with spouse has improved.   Social Determinants of Health    Financial Resource Strain: Low Risk    Difficulty of Paying Living Expenses: Not hard at all  Food Insecurity: No Food Insecurity   Worried About Charity fundraiser in the Last Year: Never true   [place] in the Last Year: Never true  Transportation Needs: No Transportation Needs   Lack of Transportation (Medical): No   Lack of Transportation (Non-Medical): No  Physical Activity: Sufficiently Active   Days of Exercise per Week: 3 days   Minutes of Exercise per Session: 60 min  Stress: No Stress Concern Present   Feeling of Stress : Not at all  Social Connections: Moderately Isolated   Frequency of Communication with Friends and Family: More than three times a week   Frequency of Social Gatherings with Friends and Family: More than three times a week   Attends Religious Services: Never   Marine scientist or Organizations: No   Attends Archivist Meetings: Never   Marital Status: Married     Family History:  The patient's  family history includes Breast cancer in his daughter and sister; Heart disease in his father; Stroke in his brother.   ROS:   Please see the history of present illness.    ROS All other systems reviewed and are negative.   PHYSICAL EXAM:   VS:  BP 134/80    Pulse 73    Ht 5' 11"  (1.803 m)    Wt 198 lb 12.8 oz (90.2 kg)    SpO2 97%    BMI 27.73 kg/m   Physical Exam  GEN: Well nourished, well developed, in no acute  Neck: no JVD, carotid bruits, or masses Cardiac:RRR; skipping every 3rd beat, no murmurs, rubs, or gallops  Respiratory:  clear to auscultation bilaterally, normal work of breathing GI: soft, nontender, nondistended, + BS Ext: without cyanosis, clubbing, or edema, Good distal pulses bilaterally Neuro:  Alert and Oriented x 3 Psych: euthymic mood, full affect  Wt Readings from Last 3 Encounters:  12/16/21 198 lb 12.8 oz (90.2 kg)  12/15/21 203 lb 12.8 oz (92.4 kg)  09/16/21 207 lb 6.4 oz (94.1 kg)       Studies/Labs Reviewed:   EKG:  EKG is not ordered today. EKG's reviewed from ED-NSR with atrial arrhthymias   Recent Labs: 02/11/2021: TSH 0.50 12/14/2021: ALT 25; BUN 14; Creatinine, Ser 1.03; Hemoglobin 16.2; Magnesium 1.9; Platelets 175; Potassium 5.1; Sodium 137  Lipid Panel    Component Value Date/Time   CHOL 109 06/29/2019 1112   TRIG 67.0 06/29/2019 1112   HDL 46.70 06/29/2019 1112   CHOLHDL 2 06/29/2019 1112   VLDL 13.4 06/29/2019 1112   LDLCALC 49 06/29/2019 1112    Additional studies/ records that were reviewed today include:      Risk Assessment/Calculations:         ASSESSMENT:    1. Dizziness   2. Atrial arrhythmia   3. Essential hypertension   4. Hyperlipidemia, unspecified hyperlipidemia type   5. ETOH abuse   6. History of lung cancer   7. Colon cancer metastasized to lung [facility])      PLAN:  In order of problems listed above:  Dizziness with falls in setting of heavy ETOH on Sunday, at rest Monday ? Vertigo per ED report. EKG shows NSR with PAC's atrial tachycardia-asymptomatic. Labs stable in ED. Will place 2 week ZIO and check echo  Atrial arrhythmias on EKG in ED-check monitor to rule out AFib.  HTN controlled with losartan  HLD on lipitor   ETOH-trying to cut back needs cessation  History of lung CA-for CT per PCP  History of colon CA  Family history of CAD    Shared Decision Making/Informed Consent        Medication Adjustments/Labs and Tests Ordered: Current medicines are reviewed at length with the patient today.  Concerns regarding medicines are outlined above.  Medication changes, Labs and Tests ordered today are listed in the Patient Instructions below. Patient Instructions  Medication Instructions:  Your physician recommends that you continue on your current medications as directed. Please refer to the Current Medication list given to you today.  *If you need a refill on your cardiac medications before your next  appointment, please call your pharmacy*   Lab Work: None If you have labs (blood work) drawn today and your tests are completely normal, you will receive your results only by: [place] (if you have MyChart) OR A paper copy in the mail If you have any lab test that is abnormal or we need to change your treatment, we will call you to review the results.   Testing/Procedures: Your physician has requested that you have an echocardiogram. Echocardiography is a painless test that uses sound waves to create images of your heart. It provides your doctor with information about the size and shape of your heart and how well your hearts chambers and valves are working. This procedure takes approximately one hour. There are no restrictions for this procedure.   Follow-Up: At [facility], you and your health needs are our priority.  As part of our continuing mission to provide you with exceptional heart care, we have created designated Provider Care Teams.  These Care Teams include your primary Cardiologist (physician) and Advanced Practice Providers (APPs -  Physician Assistants and Nurse Practitioners) who all work together to provide you with the care you need, when you need it.  We recommend signing up for the patient portal called "MyChart".  Sign up information is provided on this After Visit Summary.  MyChart is used to connect with patients for Virtual Visits (Telemedicine).  Patients are able to view lab/test results, encounter notes, upcoming appointments, etc.  Non-urgent messages can be sent to your provider as well.   To learn more about what you can do with MyChart, go to [email or web address].    Your next appointment:   01/13/2022 with Ermalinda Barrios, PA-C  Other Instructions  ZIO XT- Long Term Monitor Instructions  Your physician has requested you wear a ZIO patch monitor for 14 days.  This is a single patch monitor. [employer] supplies one patch monitor per enrollment.  Additional stickers are not available. Please do not apply patch if you will be having a Nuclear Stress Test,  Echocardiogram, Cardiac CT, MRI, or Chest Xray during the period you would be wearing the  monitor. The patch cannot be worn during these tests. You cannot remove and re-apply the  ZIO XT patch monitor.  Your ZIO patch monitor will be mailed 3 day USPS to your address on file. It may take 3-5 days  to receive your monitor after you have been enrolled.  Once you have received your monitor, please review the enclosed instructions. Your monitor  has already been registered assigning a specific monitor serial # to you.  Billing and Patient Assistance Program Information  We have supplied [employer] with any of your insurance information on file for billing purposes. [employer] offers a sliding scale Patient Assistance Program for patients that do not have  insurance, or whose insurance does not completely cover the cost of the ZIO monitor.  You must apply for the Patient Assistance Program to qualify for this discounted rate.  To apply, please call [employer] at [phone], select option 4, select option 2, ask to apply for  Patient Assistance Program. Theodore Demark will ask your household income, and how many people  are in your household. They will quote your out-of-pocket cost based on that information.  [employer] will also be able to set up a 54-month interest-free payment plan if needed.  Applying the monitor   Shave hair from upper left chest.  Hold abrader disc by orange tab. Rub abrader in 40 strokes over the upper left chest as  indicated in your monitor instructions.  Clean area with 4 enclosed alcohol pads. Let dry.  Apply patch as indicated in monitor instructions. Patch will be placed under collarbone on left  side of chest with arrow pointing upward.  Rub patch adhesive wings for 2 minutes. Remove white label marked "1". Remove the white  label marked "2". Rub patch adhesive wings  for 2 additional minutes.  While looking in a mirror, press and release button in center of patch. A small green light will  flash 3-4 times. This will be your only indicator that the monitor has been turned on.  Do not shower for the first 24 hours. You may shower after the first 24 hours.  Press the button if you feel a symptom. You will hear a small click. Record Date, Time and  Symptom in the Patient Logbook.  When you are ready to remove the patch, follow instructions on the last 2 pages of Patient  Logbook. Stick patch monitor onto the last page of Patient Logbook.  Place Patient Logbook in the blue and white box. Use locking tab on box and tape box closed  securely. The blue and white box has prepaid postage on it. Please place it in the mailbox as  soon as possible. Your physician should have your test results approximately 7 days after the  monitor [facility]  Call [place]at [phone] you have questions regarding  your ZIO XT patch monitor. Call them immediately if you see an orange light blinking on your  monitor.  If your monitor falls off in less than 4 days, contact our Monitor department at [phone]  If  your monitor becomes loose or falls off after 4 days call [employer] at [phone] for  suggestions on securing your monitor     Signed, Ermalinda Barrios, Hershal Coria  12/16/2021 9:02 AM    [address] [place], [place], [place]  [address] Phone: [phone]; Fax: [phone]

## 2021-12-16 NOTE — Patient Instructions (Addendum)
Medication Instructions:  Your physician recommends that you continue on your current medications as directed. Please refer to the Current Medication list given to you today.  *If you need a refill on your cardiac medications before your next appointment, please call your pharmacy*   Lab Work: None If you have labs (blood work) drawn today and your tests are completely normal, you will receive your results only by: [place] (if you have MyChart) OR A paper copy in the mail If you have any lab test that is abnormal or we need to change your treatment, we will call you to review the results.   Testing/Procedures: Your physician has requested that you have an echocardiogram. Echocardiography is a painless test that uses sound waves to create images of your heart. It provides your doctor with information about the size and shape of your heart and how well your hearts chambers and valves are working. This procedure takes approximately one hour. There are no restrictions for this procedure.   Follow-Up: At [facility], you and your health needs are our priority.  As part of our continuing mission to provide you with exceptional heart care, we have created designated Provider Care Teams.  These Care Teams include your primary Cardiologist (physician) and Advanced Practice Providers (APPs -  Physician Assistants and Nurse Practitioners) who all work together to provide you with the care you need, when you need it.  We recommend signing up for the patient portal called "MyChart".  Sign up information is provided on this After Visit Summary.  MyChart is used to connect with patients for Virtual Visits (Telemedicine).  Patients are able to view lab/test results, encounter notes, upcoming appointments, etc.  Non-urgent messages can be sent to your provider as well.   To learn more about what you can do with MyChart, go to [email or web address].    Your next appointment:   01/13/2022 with  Ermalinda Barrios, PA-C  Other Instructions  [place] Monitor Instructions  Your physician has requested you wear a ZIO patch monitor for 14 days.  This is a single patch monitor. [employer] supplies one patch monitor per enrollment. Additional stickers are not available. Please do not apply patch if you will be having a Nuclear Stress Test,  Echocardiogram, Cardiac CT, MRI, or Chest Xray during the period you would be wearing the  monitor. The patch cannot be worn during these tests. You cannot remove and re-apply the  ZIO XT patch monitor.  Your ZIO patch monitor will be mailed 3 day USPS to your address on file. It may take 3-5 days  to receive your monitor after you have been enrolled.  Once you have received your monitor, please review the enclosed instructions. Your monitor  has already been registered assigning a specific monitor serial # to you.  Billing and Patient Assistance Program Information  We have supplied [employer] with any of your insurance information on file for billing purposes. [employer] offers a sliding scale Patient Assistance Program for patients that do not have  insurance, or whose insurance does not completely cover the cost of the ZIO monitor.  You must apply for the Patient Assistance Program to qualify for this discounted rate.  To apply, please call [employer] at [phone], select option 4, select option 2, ask to apply for  Patient Assistance Program. Theodore Demark will ask your household income, and how many people  are in your household. They will quote your out-of-pocket cost based on that information.  [employer] will also  be able to set up a 52-month, interest-free payment plan if needed.  Applying the monitor   Shave hair from upper left chest.  Hold abrader disc by orange tab. Rub abrader in 40 strokes over the upper left chest as  indicated in your monitor instructions.  Clean area with 4 enclosed alcohol pads. Let dry.  Apply patch as indicated in  monitor instructions. Patch will be placed under collarbone on left  side of chest with arrow pointing upward.  Rub patch adhesive wings for 2 minutes. Remove white label marked "1". Remove the white  label marked "2". Rub patch adhesive wings for 2 additional minutes.  While looking in a mirror, press and release button in center of patch. A small green light will  flash 3-4 times. This will be your only indicator that the monitor has been turned on.  Do not shower for the first 24 hours. You may shower after the first 24 hours.  Press the button if you feel a symptom. You will hear a small click. Record Date, Time and  Symptom in the Patient Logbook.  When you are ready to remove the patch, follow instructions on the last 2 pages of Patient  Logbook. Stick patch monitor onto the last page of Patient Logbook.  Place Patient Logbook in the blue and white box. Use locking tab on box and tape box closed  securely. The blue and white box has prepaid postage on it. Please place it in the mailbox as  soon as possible. Your physician should have your test results approximately 7 days after the  monitor has been mailed back to [facility].  Call [place] at [phone] if you have questions regarding  your ZIO XT patch monitor. Call them immediately if you see an orange light blinking on your  monitor.  If your monitor falls off in less than 4 days, contact our Monitor department at [phone].  If your monitor becomes loose or falls off after 4 days call [employer] at [phone] for  suggestions on securing your monitor

## 2021-12-16 NOTE — Progress Notes (Unsigned)
Applied a 14 aday Zio Xt monitor to patient in the office  Dr Tamala Julian to read

## 2021-12-24 ENCOUNTER — Encounter: Payer: Self-pay | Admitting: Physical Therapy

## 2021-12-24 ENCOUNTER — Ambulatory Visit (HOSPITAL_BASED_OUTPATIENT_CLINIC_OR_DEPARTMENT_OTHER): Payer: Medicare Other | Admitting: Physical Therapy

## 2021-12-24 ENCOUNTER — Other Ambulatory Visit: Payer: Self-pay

## 2021-12-24 ENCOUNTER — Ambulatory Visit: Payer: Medicare Other | Attending: Family Medicine | Admitting: Physical Therapy

## 2021-12-24 DIAGNOSIS — R2681 Unsteadiness on feet: Secondary | ICD-10-CM | POA: Diagnosis not present

## 2021-12-24 DIAGNOSIS — R2689 Other abnormalities of gait and mobility: Secondary | ICD-10-CM

## 2021-12-24 DIAGNOSIS — R42 Dizziness and giddiness: Secondary | ICD-10-CM

## 2021-12-24 DIAGNOSIS — H8112 Benign paroxysmal vertigo, left ear: Secondary | ICD-10-CM | POA: Diagnosis not present

## 2021-12-24 NOTE — Therapy (Signed)
[facility] [address], [place], [place], [address] Phone: [phone]   Fax:  [phone]  Physical Therapy Evaluation  Patient Details  Name: Jesus [name] MRN: [id number] Date of Birth: 05-07-1946 Referring Provider (PT): Vivi Barrack, MD   Encounter Date: 12/24/2021   PT End of Session - 12/24/21 1132     Visit Number 1    Number of Visits 13    Date for PT Re-Evaluation 02/04/22    Authorization Type UHC Medicare    PT Start Time 1025    PT Stop Time 1123    PT Time Calculation (min) 38 min    Activity Tolerance Patient tolerated treatment well    Behavior During Therapy [facility] for tasks assessed/performed             Past Medical History:  Diagnosis Date   Anxiety    Arthritis    Bowel obstruction ([place])    Cancer ([place])    Colon cancer ([place])    2000   Colon polyp    Depression    Elevated LFTs    Gallstones    GERD (gastroesophageal reflux disease)    Hyperlipidemia    Hypertension    Lung cancer ([place])    2010    Past Surgical History:  Procedure Laterality Date   ANKLE FUSION Left    crushed ankle while in high school   APPENDECTOMY     1960's   [place]     with reanastomosis   PROSTATE SURGERY      There were no vitals filed for this visit.    Subjective Assessment - 12/24/21 1047     Subjective Patient reports that he had an episode of dizziness on 12/14/21 after which he went to the ED; had fallen 2x the day before. Denies head trauma. Episode lasted 1 minute and occurred when he stood up out of a chair, requiring him to sit back down. Reports occasional dizziness since then, but not much. Describes it as lightheaded. Worse with standing up and moving quickly. Denies head trauma, infection/illness, vision changes/double vision, hearing loss, tinnitus, otalgia, migraines.    Pertinent History anxiety, lung CA s/p resection 2010, depression, GERD, HLD, HTN, L  ankle fusion    Limitations Sitting;Standing;Walking;House hold activities    Diagnostic tests none recent    Patient Stated Goals "I don't want to fall and want to walk better"    Currently in Pain? Other (Comment)   referral not pain related               [facility] PT Assessment - 12/24/21 1052       Assessment   Medical Diagnosis Vertigo    Referring Provider (PT) Vivi Barrack, MD    Onset Date/Surgical Date 12/14/21    Next MD Visit not scheduled    Prior Therapy yes- for knee      Precautions   Precautions Fall    Precaution Comments wearing heart monitor      Balance Screen   Has the patient fallen in the past 6 months Yes    How many times? 3    Has the patient had a decrease in activity level because of a fear of falling?  Yes    Is the patient reluctant to leave their home because of a fear of falling?  No      Home Ecologist residence  Living Arrangements Spouse/significant other    Available Help at Discharge Family    Type of [place] to enter    Entrance Stairs-Number of Steps 1    Entrance Stairs-Rails Right    Home Layout Two level;Able to live on main level with bedroom/bathroom   does not go upstairs   Research scientist (life sciences) - 2 wheels;Grab bars - tub/shower   walking stick     Prior Function   Level of Independence Independent    Vocation Retired    Leisure walking, Agricultural consultant   Overall Cognitive Status Within Functional Limits for tasks assessed      Sensation   Light Touch Appears Intact      Coordination   Coordination and Movement Description alt pron/supination intact    Finger Nose Finger Test intact B      Ambulation/Gait   Assistive device None    Gait Pattern Step-to pattern;Step-through pattern;Decreased step length - left;Decreased step length - right;Lateral trunk lean to left   difficulty/slowness with R swing phase; crouched   Ambulation Surface  Level;Indoor    Gait velocity decreased                    Vestibular Assessment - 12/24/21 1057       Oculomotor Exam   Oculomotor Alignment Normal    Ocular ROM WFL    Spontaneous Absent    Smooth Pursuits Intact    Saccades Undershoots   1-2 saccades vertically     Oculomotor Exam-Fixation Suppressed    Left Head Impulse slightly positive    Right Head Impulse intact      Vestibulo-Ocular Reflex   VOR 1 Head Only (x 1 viewing) intact; slightly limited by cervical ROM    VOR Cancellation Normal      Positional Testing   Dix-Hallpike Dix-Hallpike Right;Dix-Hallpike Left    Horizontal Canal Testing Horizontal Canal Right;Horizontal Canal Left      Dix-Hallpike Right   Dix-Hallpike Right Duration 0    Dix-Hallpike Right Symptoms No nystagmus      Dix-Hallpike Left   Dix-Hallpike Left Duration 5    Dix-Hallpike Left Symptoms Upbeat, left rotatory nystagmus      Horizontal Canal Right   Horizontal Canal Right Duration 0    Horizontal Canal Right Symptoms Normal      Horizontal Canal Left   Horizontal Canal Left Duration 0    Horizontal Canal Left Symptoms Normal   mild dizziness upon supine<>L sidelying               Objective measurements completed on examination: See above findings.        Vestibular Treatment/Exercise - 12/24/21 0001       Vestibular Treatment/Exercise   Vestibular Treatment Provided Canalith Repositioning    Canalith Repositioning Epley Manuever Left       EPLEY MANUEVER LEFT   Number of Reps  1    Overall Response  Improved Symptoms     RESPONSE DETAILS LEFT tolerated well                    PT Education - 12/24/21 1131     Education Details prognosis, POC, edu on BPPV and inner ear anatomy; discussion on other possible causes of dizziness    Person(s) Educated Patient    Methods Explanation;Demonstration;Tactile cues;Verbal cues    Comprehension Verbalized understanding  PT Short  Term Goals - 12/24/21 1137       PT SHORT TERM GOAL #1   Title Patient to be independent with initial HEP.    Time 3    Period Weeks    Status New    Target Date 01/14/22               PT Long Term Goals - 12/24/21 1137       PT LONG TERM GOAL #1   Title Patient to be independent with advanced HEP.    Time 6    Period Weeks    Status New    Target Date 02/04/22      PT LONG TERM GOAL #2   Title Patient will report 0/10 dizziness with bed mobility.    Time 6    Period Weeks    Status New    Target Date 02/04/22      PT LONG TERM GOAL #3   Title Patient to score at least 20/24 on DGI in order to decrease risk of falls.    Time 6    Period Weeks    Status New    Target Date 02/04/22      PT LONG TERM GOAL #4   Title Patient to demonstrate 5xSTS test in <15 sec without c/o dizziness in order to decrease risk of falls.    Time 6    Period Weeks    Status New    Target Date 02/04/22                    Plan - 12/24/21 1133     Clinical Impression Statement Patient is a 76 y/o M presenting to [facility] with c/o acute dizziness since 12/14/21. ED visit on 12/14/21 d/t onset of dizziness and 2 falls related to intoxication the day prior. Patient refused imaging as part of work up. Since initial onset, transient sensation of lightheadedness occurs with standing up and moving quickly. Denies head trauma, infection/illness, vision changes/double vision, hearing loss, tinnitus, otalgia, migraines. Patient today presenting with slight undershooting with saccades, slightly positive L HIT, positive L DH, and gait deviations. Patient was treated with L Epley and tolerated this well. Would benefit from skilled PT services 1-2x/week for 6 weeks to address aforementioned impairments in order to optimize level of function.    Personal Factors and Comorbidities Age;Fitness;Comorbidity 3+;Time since onset of injury/illness/exacerbation;Past/Current Experience    Comorbidities  anxiety, lung CA s/p resection 2010, depression, GERD, HLD, HTN, L ankle fusion    Examination-Activity Limitations Transfers;Locomotion Level;Bathing;Bed Mobility;Reach Overhead;Bend;Sit;Carry;Sleep;Squat;Dressing;Stairs;Stand;Lift;Hygiene/Grooming    Examination-Participation Restrictions [employer];Shop;Driving;Community Activity;Cleaning;Church;Meal Prep    Stability/Clinical Decision Making Evolving/Moderate complexity    Clinical Decision Making Moderate    Rehab Potential Good    PT Frequency Other (comment)   1-2x   PT Duration 6 weeks    PT Treatment/Interventions ADLs/Self Care Home Management;Canalith Repostioning;Cryotherapy;DME Instruction;Gait training;Stair training;Functional mobility training;Moist Heat;Therapeutic activities;Therapeutic exercise;Balance training;Neuromuscular re-education;Manual techniques;Patient/family education;Passive range of motion;Dry needling;Energy conservation;Vestibular;Taping    PT Next Visit Plan check orthostatics, assess DGI, initiate VOR and habituation    Consulted and Agree with Plan of Care Patient             Patient will benefit from skilled therapeutic intervention in order to improve the following deficits and impairments:  Abnormal gait, Dizziness, Decreased activity tolerance, Decreased balance, Postural dysfunction  Visit Diagnosis: BPPV (benign paroxysmal positional vertigo), left  Dizziness and giddiness  Unsteadiness on feet  Other abnormalities  of gait and mobility     Problem List Patient Active Problem List   Diagnosis Date Noted   Actinic keratosis 10/07/2020   Alcohol abuse 04/22/2020   BPH (benign prostatic hyperplasia) s/p TURP 2021 04/22/2020   Other retention of urine 02/10/2020   Aortic aneurysm ([place]) 02/10/2020   Sinus tachycardia 02/08/2020   Essential hypertension 06/29/2019   Dyslipidemia 06/29/2019   GERD (gastroesophageal reflux disease) 06/29/2019   Anxiety 06/29/2019   Lung cancer  ([place]) s/p resection in 2010 06/29/2019   Rectal cancer ([place])    Hypercholesterolemia 03/08/2012   Colon cancer metastasized to lung ([place]) 05/24/2011   Vitamin B12 deficiency 09/30/2010   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     Janene Harvey, PT, DPT 12/24/21 11:40 AM   [facility]. [address], [place], [place], [address] Phone: [phone]   Fax:  [phone]  Name: Jesus [name] MRN: [id number] Date of Birth: 07-10-1946

## 2021-12-25 ENCOUNTER — Other Ambulatory Visit: Payer: Self-pay | Admitting: Family Medicine

## 2021-12-28 ENCOUNTER — Ambulatory Visit: Payer: Medicare Other | Admitting: Physical Therapy

## 2021-12-29 ENCOUNTER — Encounter: Payer: Self-pay | Admitting: Physical Therapy

## 2021-12-29 ENCOUNTER — Ambulatory Visit: Payer: Medicare Other | Admitting: Physical Therapy

## 2021-12-29 ENCOUNTER — Other Ambulatory Visit: Payer: Self-pay

## 2021-12-29 DIAGNOSIS — R2681 Unsteadiness on feet: Secondary | ICD-10-CM

## 2021-12-29 DIAGNOSIS — R42 Dizziness and giddiness: Secondary | ICD-10-CM | POA: Diagnosis not present

## 2021-12-29 DIAGNOSIS — H8112 Benign paroxysmal vertigo, left ear: Secondary | ICD-10-CM | POA: Diagnosis not present

## 2021-12-29 DIAGNOSIS — R2689 Other abnormalities of gait and mobility: Secondary | ICD-10-CM | POA: Diagnosis not present

## 2021-12-29 NOTE — Therapy (Signed)
[facility] ?[address], [address] ?[place], [place], [address] ?Phone: [phone]   Fax:  [phone] ? ?Physical Therapy Treatment ? ?Patient Details  ?Name: Jesus [name] ?MRN: [id number] ?Date of Birth: 03-25-1946 ?Referring Provider (PT): Vivi Barrack, MD ? ? ?Encounter Date: 12/29/2021 ? ? PT End of Session - 12/29/21 1016   ? ? Visit Number 2   ? Number of Visits 13   ? Date for PT Re-Evaluation 02/04/22   ? Authorization Type UHC Medicare   ? PT Start Time [phone]   ? PT Stop Time 1015   ? PT Time Calculation (min) 41 min   ? Equipment Utilized During Treatment Gait belt   ? Activity Tolerance Patient tolerated treatment well   ? Behavior During Therapy [facility] for tasks assessed/performed   ? ?  ?  ? ?  ? ? ?Past Medical History:  ?Diagnosis Date  ? Anxiety   ? Arthritis   ? Bowel obstruction ([place])   ? Cancer [facility])   ? Colon cancer ([place])   ? 2000  ? Colon polyp   ? Depression   ? Elevated LFTs   ? Gallstones   ? GERD (gastroesophageal reflux disease)   ? Hyperlipidemia   ? Hypertension   ? Lung cancer ([place])   ? 2010  ? ? ?Past Surgical History:  ?Procedure Laterality Date  ? ANKLE FUSION Left   ? crushed ankle while in high school  ? APPENDECTOMY    ? 1960's  ? CHOLECYSTECTOMY    ? COLON SURGERY    ? with reanastomosis  ? PROSTATE SURGERY    ? ? ?There were no vitals filed for this visit. ? ? Subjective Assessment - 12/29/21 0935   ? ? Subjective Was in [place] all weekend and did not feel dizzy. Had some pain in his neck.   ? Pertinent History anxiety, lung CA s/p resection 2010, depression, GERD, HLD, HTN, L ankle fusion   ? Diagnostic tests none recent   ? Patient Stated Goals "I don't want to fall and want to walk better"   ? Currently in Pain? Other (Comment)   ? ?  ?  ? ?  ? ? ? ? ? [facility] PT Assessment - 12/29/21 0001   ? ?  ? Standardized Balance Assessment  ? Standardized Balance Assessment Dynamic Gait Index   ?  ? Dynamic Gait Index  ? Level Surface  Mild Impairment   ? Change in Gait Speed Moderate Impairment   ? Gait with Horizontal Head Turns Mild Impairment   ? Gait with Vertical Head Turns Mild Impairment   ? Gait and Pivot Turn Normal   ? Step Over Obstacle Mild Impairment   ? Step Around Obstacles Normal   ? Steps Mild Impairment   mild imbalance without handrails  ? Total Score 17   ? ?  ?  ? ?  ? ? ? ? ? ? Vestibular Assessment - 12/29/21 0001   ? ?  ? Orthostatics  ? BP supine (x 5 minutes) 140/84   ? HR supine (x 5 minutes) 78   ? BP sitting 123/78   ? HR sitting 72   ? BP standing (after 1 minute) 130/80   ? HR standing (after 1 minute) 101   ? BP standing (after 3 minutes) 142/80   ? HR standing (after 3 minutes) 83   ? Orthostatics Comment denied lightheadedness   ? ?  ?  ? ?  ? ? ? ? ? ? ? ? ? ? ?  [facility] Adult PT Treatment/Exercise - 12/29/21 0001   ? ?  ? Neuro Re-ed   ? Neuro Re-ed Details  1/2 turns to targets 5x each- c/o 2/10 dizziness   ? ?  ?  ? ?  ? ? Vestibular Treatment/Exercise - 12/29/21 0001   ? ?  ? Vestibular Treatment/Exercise  ? Gaze Exercises X1 Viewing Horizontal;X1 Viewing Vertical   ?  ? X1 Viewing Horizontal  ? Foot Position standing   ? Reps --   30"  ? Comments 3/10 dizziness; more toruble towards L   ?  ? X1 Viewing Vertical  ? Foot Position standing   ? Reps --   30  ? Comments 3/10 dizziness   ? ?  ?  ? ?  ? ? ? ? ? ? ? ? ? PT Education - 12/29/21 1016   ? ? Education Details HEP-Access Code: [email or web address]; edu on results of orthostatic and balance testing and on methods to address orthostasis   ? Person(s) Educated Patient   ? Methods Explanation;Demonstration;Tactile cues;Verbal cues;Handout   ? Comprehension Verbalized understanding;Returned demonstration   ? ?  ?  ? ?  ? ? ? PT Short Term Goals - 12/29/21 1116   ? ?  ? PT SHORT TERM GOAL #1  ? Title Patient to be independent with initial HEP.   ? Time 3   ? Period Weeks   ? Status On-going   ? Target Date 01/14/22   ? ?  ?  ? ?  ? ? ? ? PT Long Term Goals - 12/29/21 1116    ? ?  ? PT LONG TERM GOAL #1  ? Title Patient to be independent with advanced HEP.   ? Time 6   ? Period Weeks   ? Status On-going   ? Target Date 02/04/22   ?  ? PT LONG TERM GOAL #2  ? Title Patient will report 0/10 dizziness with bed mobility.   ? Time 6   ? Period Weeks   ? Status On-going   ? Target Date 02/04/22   ?  ? PT LONG TERM GOAL #3  ? Title Patient to score at least 20/24 on DGI in order to decrease risk of falls.   ? Time 6   ? Period Weeks   ? Status On-going   ? Target Date 02/04/22   ?  ? PT LONG TERM GOAL #4  ? Title Patient to demonstrate 5xSTS test in <15 sec without c/o dizziness in order to decrease risk of falls.   ? Time 6   ? Period Weeks   ? Status On-going   ? Target Date 02/04/22   ? ?  ?  ? ?  ? ? ? ? ? ? ? ? Plan - 12/29/21 1017   ? ? Clinical Impression Statement Patient arrived to session with report of no dizziness since last session. Assessed orthostatics, which demonstrated small drop in BP with supine>sit but without symptoms. Patient scored 17/24 on DGI, indicating an increased risk of falls. Most difficulty was evident with increasing gait speed, stepping over obstacles, and stairs. Initiated gaze stabilization activities with patient demonstrating slightly increased difficulty with gaze stabilization with L head turn. Patient reported understanding of HEP update and without complaints upon leaving.   ? Personal Factors and Comorbidities Age;Fitness;Comorbidity 3+;Time since onset of injury/illness/exacerbation;Past/Current Experience   ? Comorbidities anxiety, lung CA s/p resection 2010, depression, GERD, HLD, HTN, L ankle fusion   ? Examination-Activity Limitations Transfers;Locomotion  Level;Bathing;Bed Mobility;Reach Overhead;Bend;Sit;Carry;Sleep;Squat;Dressing;Stairs;Stand;Lift;Hygiene/Grooming   ? Examination-Participation Restrictions [employer];Shop;Driving;Community Activity;Cleaning;Church;Meal Prep   ? Stability/Clinical Decision Making Evolving/Moderate  complexity   ? Rehab Potential Good   ? PT Frequency Other (comment)   1-2x  ? PT Duration 6 weeks   ? PT Treatment/Interventions ADLs/Self Care Home Management;Canalith Repostioning;Cryotherapy;DME Instruction;Gait training;Stair training;Functional mobility training;Moist Heat;Therapeutic activities;Therapeutic exercise;Balance training;Neuromuscular re-education;Manual techniques;Patient/family education;Passive range of motion;Dry needling;Energy conservation;Vestibular;Taping   ? PT Next Visit Plan progress VOR and habituation   ? Consulted and Agree with Plan of Care Patient   ? ?  ?  ? ?  ? ? ?Patient will benefit from skilled therapeutic intervention in order to improve the following deficits and impairments:  Abnormal gait, Dizziness, Decreased activity tolerance, Decreased balance, Postural dysfunction ? ?Visit Diagnosis: ?BPPV (benign paroxysmal positional vertigo), left ? ?Dizziness and giddiness ? ?Unsteadiness on feet ? ?Other abnormalities of gait and mobility ? ? ? ? ?Problem List ?Patient Active Problem List  ? Diagnosis Date Noted  ? Actinic keratosis 10/07/2020  ? Alcohol abuse 04/22/2020  ? BPH (benign prostatic hyperplasia) s/p TURP 2021 04/22/2020  ? Other retention of urine 02/10/2020  ? Aortic aneurysm ([place]) 02/10/2020  ? Sinus tachycardia 02/08/2020  ? Essential hypertension 06/29/2019  ? Dyslipidemia 06/29/2019  ? GERD (gastroesophageal reflux disease) 06/29/2019  ? Anxiety 06/29/2019  ? Lung cancer [facility]) s/p resection in 2010 06/29/2019  ? Rectal cancer ([place])   ? Hypercholesterolemia 03/08/2012  ? 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) 05/24/2011  ? Vitamin B12 deficiency 09/30/2010  ? 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  ? ? ?Janene Harvey, PT, DPT ?12/29/21 11:17 AM ? ? ?[facility] ?[facility] ?[address], [address] ?[place], [place], [address] ?Phone: [phone]   Fax:  [phone] ? ?Name: Jesus [name] ?MRN: [id number] ?Date of Birth: 07-05-1946 ? ? ? ?

## 2021-12-29 NOTE — Patient Instructions (Signed)
Access Code: [email or web address] ?URL: [email or web address] ?Date: 12/29/2021 ?Prepared by: [facility] ? ?Exercises ?Standing Gaze Stabilization with Head Rotation - 1 x daily - 5 x weekly - 2 sets - 30 sec hold ?Standing Gaze Stabilization with Head Nod - 1 x daily - 5 x weekly - 2 sets - 30 sec hold ?Standing Half Turn with Support - 1 x daily - 5 x weekly - 2 sets - 10 reps ? ?

## 2021-12-30 DIAGNOSIS — M7061 Trochanteric bursitis, right hip: Secondary | ICD-10-CM | POA: Diagnosis not present

## 2021-12-30 DIAGNOSIS — M25561 Pain in right knee: Secondary | ICD-10-CM | POA: Diagnosis not present

## 2021-12-30 DIAGNOSIS — M542 Cervicalgia: Secondary | ICD-10-CM | POA: Diagnosis not present

## 2021-12-30 NOTE — Progress Notes (Unsigned)
Cardiology Office Note    Date:  12/30/2021   ID:  Sabri Teal, DOB 02/17/1946, MRN [id number]   PCP:  Vivi Barrack, MD   [place]  Cardiologist:  None *** Advanced Practice Provider:  No care team member to display Electrophysiologist:  None   [id number]}   No chief complaint on file.   History of Present Illness:  Jesus [name] is a 76 y.o. male with history of HTN, family hx CAD F died MI 21, brother died CVA 33, HLD, ETOH, lung CA s/p resection.   I saw the patient 12/16/21 with dizziness and falls in the setting of heavy ETOH. ED diagnosed vertigo. EKG atrial tachycardia with PAC's. I ordered 2 week ZIO and echo.   Past Medical History:  Diagnosis Date   Anxiety    Arthritis    Bowel obstruction ([place])    Cancer ([place])    Colon cancer ([place])    2000   Colon polyp    Depression    Elevated LFTs    Gallstones    GERD (gastroesophageal reflux disease)    Hyperlipidemia    Hypertension    Lung cancer ([place])    2010    Past Surgical History:  Procedure Laterality Date   ANKLE FUSION Left    crushed ankle while in high school   APPENDECTOMY     1960's   [place]     with reanastomosis   PROSTATE SURGERY      Current Medications: No outpatient medications have been marked as taking for the 01/13/22 encounter (Appointment) with Imogene Burn, PA-C.     Allergies:   Patient has no known allergies.   Social History   Socioeconomic History   Marital status: Married    Spouse name: Not on file   Number of children: 1   Years of education: Not on file   Highest education level: Some college, no degree  Occupational History   Occupation: Retired   Tobacco Use   Smoking status: Former    Types: Cigarettes    Quit date: 2010    Years since quitting: 13.1   Smokeless tobacco: Never  Substance and Sexual Activity   Alcohol use: Not Currently    Comment: as of 04/30/21, no alcohol x 4  months. Previously heavy user   Drug use: Never   Sexual activity: Not Currently  Other Topics Concern   Not on file  Social History Narrative    Retired. Lives part of the time in [place] and is followed by the [facility] there. Lives with spouse. Like to play golf and stay active. Playing golf twice a week. Has begun to swim everyday. No ETOH in 3 months. Relationship with spouse has improved.   Social Determinants of Health   Financial Resource Strain: Low Risk    Difficulty of Paying Living Expenses: Not hard at all  Food Insecurity: No Food Insecurity   Worried About Charity fundraiser in the Last Year: Never true   [place] in the Last Year: Never true  Transportation Needs: No Transportation Needs   Lack of Transportation (Medical): No   Lack of Transportation (Non-Medical): No  Physical Activity: Sufficiently Active   Days of Exercise per Week: 3 days   Minutes of Exercise per Session: 60 min  Stress: No Stress Concern Present   Feeling of Stress : Not at all  Social Connections: Moderately Isolated  Frequency of Communication with Friends and Family: More than three times a week   Frequency of Social Gatherings with Friends and Family: More than three times a week   Attends Religious Services: Never   Marine scientist or Organizations: No   Attends Music therapist: Never   Marital Status: Married     Family History:  The patient's ***family history includes Breast cancer in his daughter and sister; Heart disease in his father; Stroke in his brother.   ROS:   Please see the history of present illness.    ROS All other systems reviewed and are negative.   PHYSICAL EXAM:   VS:  There were no vitals taken for this visit.  Physical Exam  GEN: Well nourished, well developed, in no acute distress  HEENT: normal  Neck: no JVD, carotid bruits, or masses Cardiac:RRR; no murmurs, rubs, or gallops  Respiratory:  clear to  auscultation bilaterally, normal work of breathing GI: soft, nontender, nondistended, + BS Ext: without cyanosis, clubbing, or edema, Good distal pulses bilaterally MS: no deformity or atrophy  Skin: warm and dry, no rash Neuro:  Alert and Oriented x 3, Strength and sensation are intact Psych: euthymic mood, full affect  Wt Readings from Last 3 Encounters:  12/16/21 198 lb 12.8 oz (90.2 kg)  12/15/21 203 lb 12.8 oz (92.4 kg)  09/16/21 207 lb 6.4 oz (94.1 kg)      Studies/Labs Reviewed:   EKG:  EKG is*** ordered today.  The ekg ordered today demonstrates ***  Recent Labs: 02/11/2021: TSH 0.50 12/14/2021: ALT 25; BUN 14; Creatinine, Ser 1.03; Hemoglobin 16.2; Magnesium 1.9; Platelets 175; Potassium 5.1; Sodium 137   Lipid Panel    Component Value Date/Time   CHOL 109 06/29/2019 1112   TRIG 67.0 06/29/2019 1112   HDL 46.70 06/29/2019 1112   CHOLHDL 2 06/29/2019 1112   VLDL 13.4 06/29/2019 1112   LDLCALC 49 06/29/2019 1112    Additional studies/ records that were reviewed today include:  ***   Risk Assessment/Calculations:   {Does this patient have ATRIAL FIBRILLATION?:[id number]}     ASSESSMENT:    No diagnosis found.   PLAN:  In order of problems listed above:  Dizziness with falls in setting of heavy ETOH on Sunday, at rest Monday ? Vertigo per ED report. EKG shows NSR with PAC's atrial tachycardia-asymptomatic. Labs stable in ED. Will place 2 week ZIO and check echo   Atrial arrhythmias on EKG in ED-check monitor to rule out AFib.   HTN controlled with losartan   HLD on lipitor    ETOH-trying to cut back needs cessation   History of lung CA-for CT per PCP   History of colon CA   Family history of CAD        Shared Decision Making/Informed Consent   {Are you ordering a CV Procedure (e.g. stress test, cath, DCCV, TEE, etc)?   Press F2        :[id number]}    Medication Adjustments/Labs and Tests Ordered: Current medicines are reviewed at length  with the patient today.  Concerns regarding medicines are outlined above.  Medication changes, Labs and Tests ordered today are listed in the Patient Instructions below. There are no Patient Instructions on file for this visit.   Sumner Boast, PA-C  12/30/2021 7:43 AM    [facility] [address], [place], [place]  [address] Phone: [phone]; Fax: [phone]

## 2021-12-31 ENCOUNTER — Ambulatory Visit: Payer: Medicare Other | Admitting: Physical Therapy

## 2021-12-31 ENCOUNTER — Other Ambulatory Visit: Payer: Self-pay

## 2021-12-31 ENCOUNTER — Encounter: Payer: Self-pay | Admitting: Physical Therapy

## 2021-12-31 DIAGNOSIS — R2681 Unsteadiness on feet: Secondary | ICD-10-CM

## 2021-12-31 DIAGNOSIS — H8112 Benign paroxysmal vertigo, left ear: Secondary | ICD-10-CM | POA: Diagnosis not present

## 2021-12-31 DIAGNOSIS — R42 Dizziness and giddiness: Secondary | ICD-10-CM | POA: Diagnosis not present

## 2021-12-31 DIAGNOSIS — R2689 Other abnormalities of gait and mobility: Secondary | ICD-10-CM | POA: Diagnosis not present

## 2021-12-31 NOTE — Therapy (Signed)
[place] [facility] ?[address], [address] ?[place], [place], [address] ?Phone: [phone]   Fax:  [phone] ? ?Physical Therapy Treatment ? ?Patient Details  ?Name: Jesus [name] ?MRN: [id number] ?Date of Birth: 08/23/1946 ?Referring Provider (PT): Vivi Barrack, MD ? ? ?Encounter Date: 12/31/2021 ? ? PT End of Session - 12/31/21 1014   ? ? Visit Number 3   ? Number of Visits 13   ? Date for PT Re-Evaluation 02/04/22   ? Authorization Type UHC Medicare   ? PT Start Time [phone]   ? PT Stop Time 1013   ? PT Time Calculation (min) 40 min   ? Equipment Utilized During Treatment Gait belt   ? Activity Tolerance Patient tolerated treatment well   ? Behavior During Therapy [facility] for tasks assessed/performed   ? ?  ?  ? ?  ? ? ?Past Medical History:  ?Diagnosis Date  ? Anxiety   ? Arthritis   ? Bowel obstruction ([place])   ? Cancer [facility])   ? Colon cancer ([place])   ? 2000  ? Colon polyp   ? Depression   ? Elevated LFTs   ? Gallstones   ? GERD (gastroesophageal reflux disease)   ? Hyperlipidemia   ? Hypertension   ? Lung cancer ([place])   ? 2010  ? ? ?Past Surgical History:  ?Procedure Laterality Date  ? ANKLE FUSION Left   ? crushed ankle while in high school  ? APPENDECTOMY    ? 1960's  ? CHOLECYSTECTOMY    ? COLON SURGERY    ? with reanastomosis  ? PROSTATE SURGERY    ? ? ?There were no vitals filed for this visit. ? ? Subjective Assessment - 12/31/21 0933   ? ? Subjective Has been feeling fine. Saw ortho MD who said his hips and knees looked fine and advised to take pain relievers. Denies dizziness and reports HEP compliance.   ? Pertinent History anxiety, lung CA s/p resection 2010, depression, GERD, HLD, HTN, L ankle fusion   ? Diagnostic tests none recent   ? Patient Stated Goals "I don't want to fall and want to walk better"   ? Currently in Pain? Other (Comment)   ? ?  ?  ? ?  ? ? ? ? ? ? ? ? ? ? ? ? ? ? ? ? ? ? ? ? [place] Adult PT Treatment/Exercise - 12/31/21 0001   ? ?  ? Neuro Re-ed   ?  Neuro Re-ed Details  R/L side steps on/off foam without UE support 10x each; alt toe tap on cone 3x10 with intermittent UE support   cues for safe foot placement  ?  ? Exercises  ? Exercises Knee/Hip   ?  ? Knee/Hip Exercises: Standing  ? Forward Step Up Right;Left;1 set;10 reps;Hand Hold: 0;Step Height: 6"   ? Forward Step Up Limitations cues for safe foot placement and controlled eccentric phase   ? ?  ?  ? ?  ? ? Vestibular Treatment/Exercise - 12/31/21 0001   ? ?  ? X1 Viewing Horizontal  ? Foot Position standing   ? Reps --   30"  ? Comments 0/10 dizziness; good gaze stability; quicker pace on 2nd set   ?  ? X1 Viewing Vertical  ? Foot Position standing   ? Reps --   30"  ? Comments 1/10 dizziness; good gaze stability; quicker pace on 2nd set   ? ?  ?  ? ?  ? ? ? ? ?  Balance Exercises - 12/31/21 0001   ? ?  ? Balance Exercises: Standing  ? Gait with Head Turns Forward;2 reps   + gaze stability; 1/10 dizziness  ? Turning Right;Left;3 reps   1/2 turns to targets; c/o 1/10 dizziness  ? Other Standing Exercises Comments gait + ball toss up/down + visual tracking 4x 67ft with c/o 4-5/10 dizziness   ? ?  ?  ? ?  ? ? ? ? ? PT Education - 12/31/21 1013   ? ? Education Details update to HEP-Access Code: [email or web address]   ? Person(s) Educated Patient   ? Methods Explanation;Demonstration;Tactile cues;Verbal cues;Handout   ? Comprehension Verbalized understanding;Returned demonstration   ? ?  ?  ? ?  ? ? ? PT Short Term Goals - 12/31/21 1134   ? ?  ? PT SHORT TERM GOAL #1  ? Title Patient to be independent with initial HEP.   ? Time 3   ? Period Weeks   ? Status Achieved   ? Target Date 01/14/22   ? ?  ?  ? ?  ? ? ? ? PT Long Term Goals - 12/29/21 1116   ? ?  ? PT LONG TERM GOAL #1  ? Title Patient to be independent with advanced HEP.   ? Time 6   ? Period Weeks   ? Status On-going   ? Target Date 02/04/22   ?  ? PT LONG TERM GOAL #2  ? Title Patient will report 0/10 dizziness with bed mobility.   ? Time 6   ? Period Weeks   ?  Status On-going   ? Target Date 02/04/22   ?  ? PT LONG TERM GOAL #3  ? Title Patient to score at least 20/24 on DGI in order to decrease risk of falls.   ? Time 6   ? Period Weeks   ? Status On-going   ? Target Date 02/04/22   ?  ? PT LONG TERM GOAL #4  ? Title Patient to demonstrate 5xSTS test in <15 sec without c/o dizziness in order to decrease risk of falls.   ? Time 6   ? Period Weeks   ? Status On-going   ? Target Date 02/04/22   ? ?  ?  ? ?  ? ? ? ? ? ? ? ? Plan - 12/31/21 1014   ? ? Clinical Impression Statement Patient arrived to session with report of compliance with HEP and denies dizziness. Patient with improved gaze stability with VOR activities and with minimal symptoms, thus encouraged increased speed. Practiced gait with visual tracking and VOR; patient with c/o most severe symptoms in the sagittal vs. transverse plane. SLS activities revealed some imbalance, requiring intermittent UE support. Patient tolerated session well and reported understanding of HEP update. No complaints at end of session.   ? Personal Factors and Comorbidities Age;Fitness;Comorbidity 3+;Time since onset of injury/illness/exacerbation;Past/Current Experience   ? Comorbidities anxiety, lung CA s/p resection 2010, depression, GERD, HLD, HTN, L ankle fusion   ? Examination-Activity Limitations Transfers;Locomotion Level;Bathing;Bed Mobility;Reach Overhead;Bend;Sit;Carry;Sleep;Squat;Dressing;Stairs;Stand;Lift;Hygiene/Grooming   ? Examination-Participation Restrictions [employer];Shop;Driving;Community Activity;Cleaning;Church;Meal Prep   ? Stability/Clinical Decision Making Evolving/Moderate complexity   ? Rehab Potential Good   ? PT Frequency Other (comment)   1-2x  ? PT Duration 6 weeks   ? PT Treatment/Interventions ADLs/Self Care Home Management;Canalith Repostioning;Cryotherapy;DME Instruction;Gait training;Stair training;Functional mobility training;Moist Heat;Therapeutic activities;Therapeutic exercise;Balance  training;Neuromuscular re-education;Manual techniques;Patient/family education;Passive range of motion;Dry needling;Energy conservation;Vestibular;Taping   ? PT Next Visit Plan progress  VOR and habituation   ? Consulted and Agree with Plan of Care Patient   ? ?  ?  ? ?  ? ? ?Patient will benefit from skilled therapeutic intervention in order to improve the following deficits and impairments:  Abnormal gait, Dizziness, Decreased activity tolerance, Decreased balance, Postural dysfunction ? ?Visit Diagnosis: ?BPPV (benign paroxysmal positional vertigo), left ? ?Dizziness and giddiness ? ?Unsteadiness on feet ? ?Other abnormalities of gait and mobility ? ? ? ? ?Problem List ?Patient Active Problem List  ? Diagnosis Date Noted  ? Actinic keratosis 10/07/2020  ? Alcohol abuse 04/22/2020  ? BPH (benign prostatic hyperplasia) s/p TURP 2021 04/22/2020  ? Other retention of urine 02/10/2020  ? Aortic aneurysm ([place]) 02/10/2020  ? Sinus tachycardia 02/08/2020  ? Essential hypertension 06/29/2019  ? Dyslipidemia 06/29/2019  ? GERD (gastroesophageal reflux disease) 06/29/2019  ? Anxiety 06/29/2019  ? Lung cancer [facility]) s/p resection in 2010 06/29/2019  ? Rectal cancer ([place])   ? Hypercholesterolemia 03/08/2012  ? 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 Vitamin B12 deficiency 09/30/2010  ? 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  ? ? ?Janene Harvey, PT, DPT ?12/31/21 11:36 AM ? ? ?[place] [facility] ?[address], [address] ?[place], [place], [address] ?Phone: [phone]   Fax:  [phone] ? ?Name: Jesus [name] ?MRN: [id number] ?Date of Birth: [birth year] ? ? ? ?

## 2022-01-02 DIAGNOSIS — R42 Dizziness and giddiness: Secondary | ICD-10-CM | POA: Diagnosis not present

## 2022-01-02 DIAGNOSIS — I498 Other specified cardiac arrhythmias: Secondary | ICD-10-CM | POA: Diagnosis not present

## 2022-01-04 ENCOUNTER — Ambulatory Visit: Payer: Medicare Other | Admitting: Physical Therapy

## 2022-01-05 ENCOUNTER — Ambulatory Visit (HOSPITAL_COMMUNITY): Payer: Medicare Other | Attending: Cardiovascular Disease

## 2022-01-05 ENCOUNTER — Other Ambulatory Visit: Payer: Self-pay

## 2022-01-05 DIAGNOSIS — R42 Dizziness and giddiness: Secondary | ICD-10-CM | POA: Insufficient documentation

## 2022-01-05 DIAGNOSIS — Z8249 Family history of ischemic heart disease and other diseases of the circulatory system: Secondary | ICD-10-CM | POA: Diagnosis not present

## 2022-01-05 DIAGNOSIS — I10 Essential (primary) hypertension: Secondary | ICD-10-CM | POA: Insufficient documentation

## 2022-01-05 DIAGNOSIS — I498 Other specified cardiac arrhythmias: Secondary | ICD-10-CM | POA: Diagnosis not present

## 2022-01-05 DIAGNOSIS — E785 Hyperlipidemia, unspecified: Secondary | ICD-10-CM | POA: Insufficient documentation

## 2022-01-05 DIAGNOSIS — I471 Supraventricular tachycardia: Secondary | ICD-10-CM | POA: Diagnosis not present

## 2022-01-05 LAB — ECHOCARDIOGRAM COMPLETE
Area-P 1/2: 2.67 cm2
S' Lateral: 2.8 cm

## 2022-01-06 ENCOUNTER — Ambulatory Visit: Payer: Medicare Other | Admitting: Physical Therapy

## 2022-01-12 ENCOUNTER — Ambulatory Visit (INDEPENDENT_AMBULATORY_CARE_PROVIDER_SITE_OTHER)
Admission: RE | Admit: 2022-01-12 | Discharge: 2022-01-12 | Disposition: A | Discharge status: Home / Self Care | Payer: Medicare Other | Source: Ambulatory Visit | Attending: Family Medicine | Admitting: Family Medicine

## 2022-01-12 ENCOUNTER — Other Ambulatory Visit: Payer: Self-pay

## 2022-01-12 ENCOUNTER — Ambulatory Visit: Payer: Medicare Other | Admitting: Physical Therapy

## 2022-01-12 DIAGNOSIS — C3412 Malignant neoplasm of upper lobe, left bronchus or lung: Secondary | ICD-10-CM | POA: Diagnosis not present

## 2022-01-12 DIAGNOSIS — R053 Chronic cough: Secondary | ICD-10-CM | POA: Diagnosis not present

## 2022-01-13 ENCOUNTER — Encounter: Payer: Self-pay | Admitting: Physician Assistant

## 2022-01-13 ENCOUNTER — Ambulatory Visit: Payer: Medicare Other | Admitting: Physician Assistant

## 2022-01-13 VITALS — BP 140/76 | HR 62 | Ht 71.0 in | Wt 205.0 lb

## 2022-01-13 DIAGNOSIS — E785 Hyperlipidemia, unspecified: Secondary | ICD-10-CM | POA: Diagnosis not present

## 2022-01-13 DIAGNOSIS — C7800 Secondary malignant neoplasm of unspecified lung: Secondary | ICD-10-CM

## 2022-01-13 DIAGNOSIS — I498 Other specified cardiac arrhythmias: Secondary | ICD-10-CM

## 2022-01-13 DIAGNOSIS — I4729 Other ventricular tachycardia: Secondary | ICD-10-CM | POA: Diagnosis not present

## 2022-01-13 DIAGNOSIS — Z85118 Personal history of other malignant neoplasm of bronchus and lung: Secondary | ICD-10-CM | POA: Diagnosis not present

## 2022-01-13 DIAGNOSIS — I77810 Thoracic aortic ectasia: Secondary | ICD-10-CM | POA: Diagnosis not present

## 2022-01-13 DIAGNOSIS — R42 Dizziness and giddiness: Secondary | ICD-10-CM | POA: Diagnosis not present

## 2022-01-13 DIAGNOSIS — Z8249 Family history of ischemic heart disease and other diseases of the circulatory system: Secondary | ICD-10-CM | POA: Diagnosis not present

## 2022-01-13 DIAGNOSIS — I471 Supraventricular tachycardia: Secondary | ICD-10-CM | POA: Diagnosis not present

## 2022-01-13 DIAGNOSIS — F1010 Alcohol abuse, uncomplicated: Secondary | ICD-10-CM

## 2022-01-13 DIAGNOSIS — I10 Essential (primary) hypertension: Secondary | ICD-10-CM | POA: Diagnosis not present

## 2022-01-13 DIAGNOSIS — C189 Malignant neoplasm of colon, unspecified: Secondary | ICD-10-CM | POA: Diagnosis not present

## 2022-01-13 MED ORDER — METOPROLOL SUCCINATE ER 25 MG PO TB24: 25.0000 mg | ORAL_TABLET | Freq: Every day | ORAL | 3 refills | Status: DC

## 2022-01-13 NOTE — Patient Instructions (Addendum)
Medication Instructions:  ?Your physician has recommended you make the following change in your medication:  ? ?START: Metoprolol Succinate 25mg  daily ? ?*If you need a refill on your cardiac medications before your next appointment, please call your pharmacy* ? ? ?Lab Work: ?None ?If you have labs (blood work) drawn today and your tests are completely normal, you will receive your results only by: ?MyChart Message (if you have MyChart) OR ?A paper copy in the mail ?If you have any lab test that is abnormal or we need to change your treatment, we will call you to review the results. ? ? ?Follow-Up: ?At [facility], you and your health needs are our priority.  As part of our continuing mission to provide you with exceptional heart care, we have created designated Provider Care Teams.  These Care Teams include your primary Cardiologist (physician) and Advanced Practice Providers (APPs -  Physician Assistants and Nurse Practitioners) who all work together to provide you with the care you need, when you need it. ? ?Your next appointment:   ?6 month(s) ? ?The format for your next appointment:   ?In Person ? ?Provider:   ?Sinclair Grooms, MD { ? ?Decrease Alcohol and Caffeine. Do not take Decongestants ? ?

## 2022-01-14 ENCOUNTER — Encounter: Payer: Self-pay | Admitting: Physical Therapy

## 2022-01-14 ENCOUNTER — Other Ambulatory Visit: Payer: Self-pay

## 2022-01-14 ENCOUNTER — Ambulatory Visit: Payer: Medicare Other | Admitting: Physical Therapy

## 2022-01-14 DIAGNOSIS — H8112 Benign paroxysmal vertigo, left ear: Secondary | ICD-10-CM | POA: Diagnosis not present

## 2022-01-14 DIAGNOSIS — R2689 Other abnormalities of gait and mobility: Secondary | ICD-10-CM | POA: Diagnosis not present

## 2022-01-14 DIAGNOSIS — R42 Dizziness and giddiness: Secondary | ICD-10-CM | POA: Diagnosis not present

## 2022-01-14 DIAGNOSIS — R2681 Unsteadiness on feet: Secondary | ICD-10-CM | POA: Diagnosis not present

## 2022-01-14 NOTE — Therapy (Signed)
[place] ?[facility] ?[address], [address] ?[place], [place], [address] ?Phone: [phone]   Fax:  [phone] ? ?Physical Therapy Discharge Summary ? ?Patient Details  ?Name: Jesus [name] ?MRN: [id number] ?Date of Birth: Jul 04, 1946 ?Referring Provider (PT): Vivi Barrack, MD ? ? ? ?Progress Note ?Reporting Period 12/24/21 to 01/14/22 ? ?See note below for Objective Data and Assessment of Progress/Goals.  ? ? ?Encounter Date: 01/14/2022 ? ? PT End of Session - 01/14/22 1007   ? ? Visit Number 4   ? Number of Visits 13   ? Date for PT Re-Evaluation 02/04/22   ? Authorization Type UHC Medicare   ? PT Start Time [phone]   ? PT Stop Time 1004   ? PT Time Calculation (min) 31 min   ? Equipment Utilized During Treatment Gait belt   ? Activity Tolerance Patient tolerated treatment well   ? Behavior During Therapy [facility] for tasks assessed/performed   ? ?  ?  ? ?  ? ? ?Past Medical History:  ?Diagnosis Date  ? Anxiety   ? Arthritis   ? Bowel obstruction ([place])   ? Cancer [facility])   ? Colon cancer ([place])   ? 2000  ? Colon polyp   ? Depression   ? Elevated LFTs   ? Gallstones   ? GERD (gastroesophageal reflux disease)   ? Hyperlipidemia   ? Hypertension   ? Lung cancer ([place])   ? 2010  ? ? ?Past Surgical History:  ?Procedure Laterality Date  ? ANKLE FUSION Left   ? crushed ankle while in high school  ? APPENDECTOMY    ? 1960's  ? CHOLECYSTECTOMY    ? COLON SURGERY    ? with reanastomosis  ? PROSTATE SURGERY    ? ? ?There were no vitals filed for this visit. ? ? Subjective Assessment - 01/14/22 0937   ? ? Subjective Reports that he was told that he has an irregular heart rhythm. Has questions about the heart rate notifications on his apple watch. Denies recent dizziness. Reports that he is moving slow but is trying to walk everyday and feels better with balance. Ready to wrap up with PT.   ? Pertinent History anxiety, lung CA s/p resection 2010, depression, GERD, HLD, HTN, L ankle fusion   ?  Diagnostic tests none recent   ? Patient Stated Goals "I don't want to fall and want to walk better"   ? Currently in Pain? No/denies   ? ?  ?  ? ?  ? ? ? ? ? [facility] PT Assessment - 01/14/22 0001   ? ?  ? Standardized Balance Assessment  ? Standardized Balance Assessment Five Times Sit to Stand   ? Five times sit to stand comments  16.34 sec   without UEs  ?  ? Dynamic Gait Index  ? Level Surface Mild Impairment   ? Change in Gait Speed Normal   ? Gait with Horizontal Head Turns Mild Impairment   ? Gait with Vertical Head Turns Normal   ? Gait and Pivot Turn Normal   ? Step Over Obstacle Normal   ? Step Around Obstacles Mild Impairment   ? Steps Normal   ? Total Score 21   ? ?  ?  ? ?  ? ? ? ? ? ? ? ? ? ? ? ? ? ? ? ? [place] Adult PT Treatment/Exercise - 01/14/22 0001   ? ?  ? Knee/Hip Exercises: Standing  ?  Other Standing Knee Exercises sidestepping with red loop around ankles 2x length of II bars   ?  ? Knee/Hip Exercises: Seated  ? Sit to Sand 1 set;10 reps;without UE support   green medball at chest  ? ?  ?  ? ?  ? ? Vestibular Treatment/Exercise - 01/14/22 0001   ? ?  ? Vestibular Treatment/Exercise  ? Habituation Exercises --   simulating bed mobility activities including sit<>supine and R/L rolling with 0/10 dizziness  ? ?  ?  ? ?  ? ? ? ? ? ? ? ? ? PT Education - 01/14/22 1006   ? ? Education Details update/consolidation of HEP; edu on normal HR and normal HR responses to exercise- advised patient to f/u with cardiology if he notices abnormalities   ? Person(s) Educated Patient   ? Methods Explanation;Demonstration;Tactile cues;Verbal cues;Handout   ? Comprehension Verbalized understanding;Returned demonstration   ? ?  ?  ? ?  ? ? ? PT Short Term Goals - 01/14/22 0941   ? ?  ? PT SHORT TERM GOAL #1  ? Title Patient to be independent with initial HEP.   ? Time 3   ? Period Weeks   ? Status Achieved   ? Target Date 01/14/22   ? ?  ?  ? ?  ? ? ? ? PT Long Term Goals - 01/14/22 0951   ? ?  ? PT LONG TERM GOAL #1  ?  Title Patient to be independent with advanced HEP.   ? Time 6   ? Period Weeks   ? Status Achieved   ? Target Date 02/04/22   ?  ? PT LONG TERM GOAL #2  ? Title Patient will report 0/10 dizziness with bed mobility.   ? Time 6   ? Period Weeks   ? Status Achieved   ? Target Date 02/04/22   ?  ? PT LONG TERM GOAL #3  ? Title Patient to score at least 20/24 on DGI in order to decrease risk of falls.   ? Time 6   ? Period Weeks   ? Status Achieved   ? Target Date 02/04/22   ?  ? PT LONG TERM GOAL #4  ? Title Patient to demonstrate 5xSTS test in <15 sec without c/o dizziness in order to decrease risk of falls.   ? Time 6   ? Period Weeks   ? Status Not Met   16.34 sec  ? Target Date 02/04/22   ? ?  ?  ? ?  ? ? ? ? ? ? ? ? Plan - 01/14/22 1007   ? ? Clinical Impression Statement Patient arrived to session with report that he was told that he has an arrhythmia of some sort and was put on a new medication. Denies dizziness and reports improvement in balance and notes that he feels ready to wrap up with PT today. Able to complete bed mobility without c/o dizziness today. Patient scored 21/24 on DGI, indicating a decreased risk of falls. Patient?s time on 5xSTS demonstrated slightly slowed transfers. Updated HEP to address functional strength and balance- patient reported understanding and without complaints at end of session. Patient has met or partially met all goals and is ready for DC at this time.   ? Personal Factors and Comorbidities Age;Fitness;Comorbidity 3+;Time since onset of injury/illness/exacerbation;Past/Current Experience   ? Comorbidities anxiety, lung CA s/p resection 2010, depression, GERD, HLD, HTN, L ankle fusion   ? Examination-Activity Limitations Transfers;Locomotion Level;Bathing;Bed  Mobility;Reach Overhead;Bend;Sit;Carry;Sleep;Squat;Dressing;Stairs;Stand;Lift;Hygiene/Grooming   ? Examination-Participation Restrictions [employer];Shop;Driving;Community Activity;Cleaning;Church;Meal Prep   ?  Stability/Clinical Decision Making Evolving/Moderate complexity   ? Rehab Potential Good   ? PT Frequency Other (comment)   1-2x  ? PT Duration 6 weeks   ? PT Treatment/Interventions ADLs/Self Care Home Management;Canalith Repostioning;Cryotherapy;DME Instruction;Gait training;Stair training;Functional mobility training;Moist Heat;Therapeutic activities;Therapeutic exercise;Balance training;Neuromuscular re-education;Manual techniques;Patient/family education;Passive range of motion;Dry needling;Energy conservation;Vestibular;Taping   ? PT Next Visit Plan DC at this time   ? Consulted and Agree with Plan of Care Patient   ? ?  ?  ? ?  ? ? ?Patient will benefit from skilled therapeutic intervention in order to improve the following deficits and impairments:  Abnormal gait, Dizziness, Decreased activity tolerance, Decreased balance, Postural dysfunction ? ?Visit Diagnosis: ?BPPV (benign paroxysmal positional vertigo), left ? ?Dizziness and giddiness ? ?Unsteadiness on feet ? ?Other abnormalities of gait and mobility ? ? ? ? ?Problem List ?Patient Active Problem List  ? Diagnosis Date Noted  ? Actinic keratosis 10/07/2020  ? Alcohol abuse 04/22/2020  ? BPH (benign prostatic hyperplasia) s/p TURP 2021 04/22/2020  ? Other retention of urine 02/10/2020  ? Aortic aneurysm ([place]) 02/10/2020  ? Sinus tachycardia 02/08/2020  ? Essential hypertension 06/29/2019  ? Dyslipidemia 06/29/2019  ? GERD (gastroesophageal reflux disease) 06/29/2019  ? Anxiety 06/29/2019  ? Lung cancer [facility]) s/p resection in 2010 06/29/2019  ? Rectal cancer ([place])   ? Hypercholesterolemia 03/08/2012  ? 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 Vitamin B12 deficiency 09/30/2010  ? 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  ? ? ?PHYSICAL THERAPY DISCHARGE SUMMARY ? ?Visits from Start of Care: 4  ? ?Current functional level related to goals / functional outcomes: ?See above clinical impression ?  ?Remaining  deficits: ?Increased time for transfers ?  ?Education / Equipment: ?HEP  ?Plan: ?Patient agrees to discharge.  Patient goals were partially met. Patient is being discharged due to meeting the stated rehab goals.    ?

## 2022-01-14 NOTE — Progress Notes (Signed)
Please inform patient of the following: ? ?His CT scan shows a new nodule. This is probably benign but they recommended repeating CT scan next year. ? ?He does have some calcification and dilation of his arteries however this is known and he can follow up with cardiology as planned for this. Do not need to make any changes to his treatment plan at this time.  ? ?Algis Greenhouse. [name], MD ?01/14/2022 12:54 PM  ?

## 2022-01-18 ENCOUNTER — Ambulatory Visit: Payer: Medicare Other | Admitting: Physical Therapy

## 2022-01-20 ENCOUNTER — Ambulatory Visit: Payer: Medicare Other | Admitting: Physical Therapy

## 2022-02-03 DIAGNOSIS — L304 Erythema intertrigo: Secondary | ICD-10-CM | POA: Diagnosis not present

## 2022-02-03 DIAGNOSIS — T490X5A Adverse effect of local antifungal, anti-infective and anti-inflammatory drugs, initial encounter: Secondary | ICD-10-CM | POA: Diagnosis not present

## 2022-02-18 DIAGNOSIS — Z96651 Presence of right artificial knee joint: Secondary | ICD-10-CM | POA: Diagnosis not present

## 2022-02-18 DIAGNOSIS — Z09 Encounter for follow-up examination after completed treatment for conditions other than malignant neoplasm: Secondary | ICD-10-CM | POA: Diagnosis not present

## 2022-03-01 NOTE — Progress Notes (Signed)
Chronic Care Management Pharmacy Note  03/08/2022 Name:  Jesus [name] MRN:  [id number] DOB:  06/30/1946  Summary: PharmD FU patient still having a cough.  Recommendations/Changes made from today's visit: Monitor cough - could be losartan related  Plan: FU 6 months   Subjective: Jesus [name] is an 76 y.o. year old male who is a primary patient of Jimmey Ralph, Katina Degree, MD.  The CCM team was consulted for assistance with disease management and care coordination needs.    Engaged with patient face to face for follow up visit in response to provider referral for pharmacy case management and/or care coordination services.   Consent to Services:  The patient was given the following information about Chronic Care Management services today, agreed to services, and gave verbal consent: 1. CCM service includes personalized support from designated clinical staff supervised by the primary care provider, including individualized plan of care and coordination with other care providers 2. 24/7 contact phone numbers for assistance for urgent and routine care needs. 3. Service will only be billed when office clinical staff spend 20 minutes or more in a month to coordinate care. 4. Only one practitioner may furnish and bill the service in a calendar month. 5.The patient may stop CCM services at any time (effective at the end of the month) by phone call to the office staff. 6. The patient will be responsible for cost sharing (co-pay) of up to 20% of the service fee (after annual deductible is met). Patient agreed to services and consent obtained.  Patient Care Team: Ardith Dark, MD as PCP - General (Family Medicine) Lyn Records, MD as PCP - Cardiology (Cardiology) Tomma Lightning, MD as Consulting Physician (Pulmonary Disease) Haverstock, Elvin So, MD as Consulting Physician (Dermatology) Lyn Records, MD as Consulting Physician (Cardiology) Erroll Luna, [facility] as Pharmacist  (Pharmacist)  Recent office visits:  09/16/2021 Adella Hare, NP; Olopatadine HCl 0.2% soln for Allergic conjunctivitis of both eyes   Recent consult visits:  None   Hospital visits:  None since last CPP visit  Objective:  Lab Results  Component Value Date   CREATININE 1.03 12/14/2021   BUN 14 12/14/2021   GFR 92.56 02/24/2021   GFRNONAA >60 12/14/2021   GFRAA 107 04/20/2021   NA 137 12/14/2021   K 5.1 12/14/2021   CALCIUM 9.5 12/14/2021   CO2 27 12/14/2021   GLUCOSE 144 (H) 12/14/2021    Lab Results  Component Value Date/Time   HGBA1C 5.3 04/24/2021 03:14 PM   GFR 92.56 02/24/2021 08:42 AM   GFR 89.07 02/19/2021 09:12 AM    Last diabetic Eye exam: No results found for: HMDIABEYEEXA  Last diabetic Foot exam: No results found for: HMDIABFOOTEX   Lab Results  Component Value Date   CHOL 109 06/29/2019   HDL 46.70 06/29/2019   LDLCALC 49 06/29/2019   TRIG 67.0 06/29/2019   CHOLHDL 2 06/29/2019       Latest Ref Rng & Units 12/14/2021    2:52 PM 04/20/2021   12:00 AM 02/19/2021    9:12 AM  Hepatic Function  Total Protein 6.5 - 8.1 g/dL 7.1    7.0    Albumin 3.5 - 5.0 g/dL 4.2   4.2      4.4    AST 15 - 41 U/L 33   17      38    ALT 0 - 44 U/L 25   13      58  Alk Phosphatase 38 - 126 U/L 58   55      60    Total Bilirubin 0.3 - 1.2 mg/dL 1.2    0.9    Bilirubin, Direct 0.0 - 0.2 mg/dL 0.2    0.2       This result is from an external source.    Lab Results  Component Value Date/Time   TSH 0.50 02/11/2021 11:34 AM   TSH 0.81 07/30/2019 10:44 AM   FREET4 0.84 07/30/2019 10:44 AM       Latest Ref Rng & Units 12/14/2021    2:52 PM 04/20/2021   12:00 AM 02/11/2021   11:34 AM  CBC  WBC 4.0 - 10.5 K/uL 9.9   8.0      5.3    Hemoglobin 13.0 - 17.0 g/dL 91.4   78.2      95.6    Hematocrit 39.0 - 52.0 % 48.2   46      44.6    Platelets 150 - 400 K/uL 175   193      161.0       This result is from an external source.    No results found for:  VD25OH  Clinical ASCVD: No  The ASCVD Risk score (Arnett DK, et al., 2019) failed to calculate for the following reasons:   The valid HDL cholesterol range is 0.517 to 2.586 mmol/L   The valid total cholesterol range is 3.362 to 8.275 mmol/L       07/23/2021    1:28 PM 03/26/2021    3:16 PM 02/26/2021    3:44 PM  Depression screen PHQ 2/9  Decreased Interest 0 0   Down, Depressed, Hopeless 0 0   PHQ - 2 Score 0 0      Information is confidential and restricted. Go to Review Flowsheets to unlock data.      Social History   Tobacco Use  Smoking Status Former   Types: Cigarettes   Quit date: 2010   Years since quitting: 13.3  Smokeless Tobacco Never   BP Readings from Last 3 Encounters:  01/13/22 140/76  12/16/21 134/80  12/15/21 121/76   Pulse Readings from Last 3 Encounters:  01/13/22 62  12/16/21 73  12/15/21 83   Wt Readings from Last 3 Encounters:  01/13/22 205 lb (93 kg)  12/16/21 198 lb 12.8 oz (90.2 kg)  12/15/21 203 lb 12.8 oz (92.4 kg)   BMI Readings from Last 3 Encounters:  01/13/22 28.59 kg/m  12/16/21 27.73 kg/m  12/15/21 28.42 kg/m    Assessment/Interventions: Review of patient past medical history, allergies, medications, health status, including review of consultants reports, laboratory and other test data, was performed as part of comprehensive evaluation and provision of chronic care management services.   SDOH:  (Social Determinants of Health) assessments and interventions performed: Yes  Financial Resource Strain: Low Risk    Difficulty of Paying Living Expenses: Not hard at all    SDOH Screenings   Alcohol Screen: Not on file  Depression (PHQ2-9): Low Risk    PHQ-2 Score: 0  Financial Resource Strain: Low Risk    Difficulty of Paying Living Expenses: Not hard at all  Food Insecurity: No Food Insecurity   Worried About Programme researcher, broadcasting/film/video in the Last Year: Never true   Ran Out of Food in the Last Year: Never true  Housing: Low Risk     Last Housing Risk Score: 0  Physical Activity: Sufficiently Active   Days  of Exercise per Week: 3 days   Minutes of Exercise per Session: 60 min  Social Connections: Moderately Isolated   Frequency of Communication with Friends and Family: More than three times a week   Frequency of Social Gatherings with Friends and Family: More than three times a week   Attends Religious Services: Never   Database administrator or Organizations: No   Attends Engineer, structural: Never   Marital Status: Married  Stress: No Stress Concern Present   Feeling of Stress : Not at all  Tobacco Use: Medium Risk   Smoking Tobacco Use: Former   Smokeless Tobacco Use: Never   Passive Exposure: Not on Chartered certified accountant Needs: No Transportation Needs   Lack of Transportation (Medical): No   Lack of Transportation (Non-Medical): No    CCM Care Plan  No Known Allergies  Medications Reviewed Today     Reviewed by Erroll Luna, [facility] (Pharmacist) on 03/08/22 at 1445  Med List Status: <None>   Medication Order Taking? Sig Documenting Provider Last Dose Status Informant  acetaminophen (TYLENOL) 500 MG tablet [id number] Yes Take 500 mg by mouth every 6 (six) hours as needed. [provider] Taking Active   ALPRAZolam Prudy Feeler) 0.5 MG tablet [id number]  TAKE 1 TABLET BY MOUTH TWICE DAILY AS NEEDED Ardith Dark, MD  Active   atorvastatin (LIPITOR) 20 MG tablet [id number] Yes Take 1 tablet (20 mg total) by mouth daily. Ardith Dark, MD Taking Active   B Complex Vitamins (B COMPLEX PO) [id number]  Take by mouth. [provider]  Active   citalopram (CELEXA) 20 MG tablet [id number]  TAKE 1 TABLET(20 MG) BY MOUTH DAILY Ardith Dark, MD  Active   ibuprofen (ADVIL) 200 MG tablet [id number] No Take 200 mg by mouth as needed.  Patient not taking: Reported on 03/08/2022   [provider] Not Taking Active   losartan (COZAAR) 50 MG tablet [id number]  Take 25mg  (half tablet) dail.  Ardith Dark, MD  Active   meloxicam [facility]) 15 MG tablet [id number] Yes Take 15 mg by mouth daily. [provider] Taking Active   metoprolol succinate (TOPROL XL) 25 MG 24 hr tablet [id number]  Take 1 tablet (25 mg total) by mouth daily. Dyann Kief, PA-C  Active   Multiple Vitamin (MULTIVITAMIN PO) [id number]  Take 1 tablet by mouth daily. [provider]  Active   Multiple Vitamins-Minerals (ZINC PO) [id number]  Take 1 tablet by mouth daily. [provider]  Active   Olopatadine HCl (PATADAY) 0.2 % SOLN [id number]  Apply 1 drop to each eye once daily. Dulce Sellar, NP  Active   omeprazole (PRILOSEC) 20 MG capsule [id number]  TAKE 1 CAPSULE(20 MG) BY MOUTH DAILY Ardith Dark, MD  Active     Discontinued 10/07/20 1330   VITAMIN D PO [id number]  Take 800 Units by mouth.  [provider]  Active             Patient Active Problem List   Diagnosis Date Noted   Actinic keratosis 10/07/2020   Alcohol abuse 04/22/2020   BPH (benign prostatic hyperplasia) s/p TURP 2021 04/22/2020   Other retention of urine 02/10/2020   Aortic aneurysm (HCC) 02/10/2020   Sinus tachycardia 02/08/2020   Essential hypertension 06/29/2019   Dyslipidemia 06/29/2019   GERD (gastroesophageal reflux disease) 06/29/2019   Anxiety 06/29/2019   Lung cancer (HCC) s/p resection in 2010 06/29/2019   Rectal cancer (HCC)  Hypercholesterolemia 03/08/2012   Colon cancer metastasized to lung (HCC) 05/24/2011   Vitamin B12 deficiency 09/30/2010   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    Immunization History  Administered Date(s) Administered   Fluad Quad(high Dose 65+) 07/30/2019, 08/01/2020, 08/31/2021   Influenza, High Dose Seasonal PF 07/30/2019, 08/01/2020   Influenza,inj,Quad PF,6+ Mos 11/08/2011   Influenza-Unspecified 10/14/2012, 08/05/2013, 08/16/2017   PFIZER(Purple Top)SARS-COV-2 Vaccination 11/30/2019, 12/25/2019,  07/06/2020   Zoster, Live 12/21/2012, 08/05/2013    Conditions to be addressed/monitored:  HTN, GERD, HLD, Anxiety,  Care Plan : General Pharmacy (Adult)  Updates made by Erroll Luna, RPH since 03/08/2022 12:00 AM     Problem: HTN, GERD, HLD, Anxiety,   Priority: High  Onset Date: 02/28/2022     Long-Range Goal: Patient-Specific Goal   Start Date: 08/31/2021  Expected End Date: 02/28/2022  Recent Progress: On track  Priority: High  Note:   Current Barriers:  Possible losartan related cough  Pharmacist Clinical Goal(s):  Patient will maintain control of BP as evidenced by monitoring  through collaboration with PharmD and provider.   Interventions: 1:1 collaboration with Ardith Dark, MD regarding development and update of comprehensive plan of care as evidenced by provider attestation and co-signature Inter-disciplinary care team collaboration (see longitudinal plan of care) Comprehensive medication review performed; medication list updated in electronic medical record  Hypertension (BP goal <140/90) -Controlled -Current treatment: Losartan 50mg  one-half tablet daily Appropriate, Effective, Safe, Accessible -Medications previously tried: HCTZ 25mg  daily  -Current home readings: not checking at home -Current dietary habits: tries to eat a lot of fish, cannot eat steak due to esophagus issues.  Trying to limit alcohol -Current exercise habits: plays a lot of golf -Denies hypotensive/hypertensive symptoms -Educated on BP goals and benefits of medications for prevention of heart attack, stroke and kidney damage; Exercise goal of 150 minutes per week; Symptoms of hypotension and importance of maintaining adequate hydration; -Counseled to monitor BP at home as able, document, and provide log at future appointments -He mentions a long time dry cough that he has to use cough drops from [facility] for on a daily basis.  Discussed the potential that this could be related to  losartan.  However, at this time since the cough is tolerable no changes recommended.  IF cough persists or worsens, we could look to d/c the medication as long as BP remains controlled.   -Recommended to continue current medication  Update 03/08/22 Continues to take losartan and is still having dry cough.   He denies any dizziness at home. He did have a fall a few months back.  Likely just due to motor issues. Chest CT came back negative for malignancy or any reason to be causing the cough. He is in [facility] and plans to come in if it is still bothering him when he gets back.  Could be caused by losartan but unlikely due to onset of cough and timing of when he started losartan. No changes at this time - continue to monitor BP and adjust as needed.   Hyperlipidemia: (LDL goal < 100) -Controlled -Current treatment: Atorvastatin 20mg  daily Appropriate, Effective, Safe, Accessible -Medications previously tried: none noted  -Current dietary patterns: see above -Current exercise habits: see above -Educated on Cholesterol goals;  Benefits of statin for ASCVD risk reduction; Importance of limiting foods high in cholesterol; Does mention some cramping in legs and hands.  Most recent cholesterol in our office was 2020.  However did find updated one  from late 2021 which continues to show control. -Recommended to continue current medication - monitor myalgias.   Could consider addition of CoQ10 for statin induced cramping. Also want to make sure vitamin D level is controlled as deficiency is linked to more statin adverse effects.  Update 03/08/22 Need updated lipid panel.  Reports continued adherence. Denies adverse effects. Recommend repeat lipid panel. Last on file from 2020. No changes at this time.  Depression/Anxiety (Goal: Minimize symptoms) -Controlled -Current treatment: Citalopram 20mg  daily Alprazolam 0.5mg  BID prn as directed -Medications previously tried/failed: none noted -PHQ9:   PHQ9 SCORE ONLY 07/23/2021 03/26/2021 01/24/2020  PHQ-9 Total Score 0 0 0  Some encounter information is confidential and restricted. Go to Review Flowsheets activity to see all data.  -Educated on Benefits of medication for symptom control Taking 2- 4 weeks to full effect, do not stop abruptly contact providers as we may want to taper you off this medication. He does not feel like he is depressed at this time.  Sleep is good besides occasional trips to the bathroom. -Recommended to continue current medication  GERD (Goal: Minimze symptoms) -Controlled -Current treatment  Omperazole 20mg  daily -Medications previously tried: none noted  -Counseled on appropriate administration 30-60 minutes before meals every morning. Does have symptoms when he does not take, reports history of hiatal hernia. Continue current medication for now - for best efficacy take daily.  Patient Goals/Self-Care Activities Patient will:  - take medications as prescribed as evidenced by patient report and record review focus on medication adherence by pill counts check blood pressure periodically, document, and provide at future appointments  Follow Up Plan: The care management team will reach out to the patient again over the next 180 days.             Medication Assistance: None required.  Patient affirms current coverage meets needs.  Compliance/Adherence/Medication fill history: Care Gaps: Pneumovax Colonoscopy  Star-Rating Drugs: LOSARTAN 50MG  TABLETS 07/24/2021 90   ATORVASTATIN 20MG  TABLETS 07/24/2021 90    Patient's preferred pharmacy is:  [id number] - [place], [place] - [address] AT [facility] OF [address] [place] [address] Phone: [phone] Fax: [phone]  [id number] [place], [place] - [address] AT [facility] [address] [place] [address] Phone: [phone] Fax:  [phone]   Uses pill box? No - prefers bottle Pt endorses 100% compliance  We discussed: Benefits of medication synchronization, packaging and delivery as well as enhanced pharmacist oversight with [employer]. Patient decided to:  Remain with current pharmacy,. Spends half the year in [place].  Care Plan and Follow Up Patient Decision:  Patient agrees to Care Plan and Follow-up.  Plan: The care management team will reach out to the patient again over the next 180 days.  Willa Frater, PharmD Clinical Pharmacist  [facility] [phone]

## 2022-03-08 ENCOUNTER — Ambulatory Visit: Payer: Medicare Other | Admitting: Pharmacist

## 2022-03-08 DIAGNOSIS — I10 Essential (primary) hypertension: Secondary | ICD-10-CM

## 2022-03-08 DIAGNOSIS — E785 Hyperlipidemia, unspecified: Secondary | ICD-10-CM

## 2022-03-08 NOTE — Patient Instructions (Addendum)
Visit Information ? ? Goals Addressed   ? ?  ?  ?  ?  ? This Visit's Progress  ?  Track and Manage My Blood Pressure-Hypertension   On track  ?  Timeframe:  Long-Range Goal ?Priority:  High ?Start Date:    08/31/21                         ?Expected End Date:  02/28/22                    ? ?Follow Up Date 12/01/21  ?  ?- check blood pressure weekly ?- choose a place to take my blood pressure (home, clinic or office, retail store) ?- write blood pressure results in a log or diary  ?  ?Why is this important?   ?You won't feel high blood pressure, but it can still hurt your blood vessels.  ?High blood pressure can cause heart or kidney problems. It can also cause a stroke.  ?Making lifestyle changes like losing a little weight or eating less salt will help.  ?Checking your blood pressure at home and at different times of the day can help to control blood pressure.  ?If the doctor prescribes medicine remember to take it the way the doctor ordered.  ?Call the office if you cannot afford the medicine or if there are questions about it.   ?  ?Notes:  ?  ? ?  ? ?Patient Care Plan: General Pharmacy (Adult)  ?  ? ?Problem Identified: HTN, GERD, HLD, Anxiety,   ?Priority: High  ?Onset Date: 02/28/2022  ?  ? ?Long-Range Goal: Patient-Specific Goal   ?Start Date: 08/31/2021  ?Expected End Date: 02/28/2022  ?Recent Progress: On track  ?Priority: High  ?Note:   ?Current Barriers:  ?Possible losartan related cough ? ?Pharmacist Clinical Goal(s):  ?Patient will maintain control of BP as evidenced by monitoring  through collaboration with PharmD and provider.  ? ?Interventions: ?1:1 collaboration with Vivi Barrack, MD regarding development and update of comprehensive plan of care as evidenced by provider attestation and co-signature ?Inter-disciplinary care team collaboration (see longitudinal plan of care) ?Comprehensive medication review performed; medication list updated in electronic medical record ? ?Hypertension (BP goal  <140/90) ?-Controlled ?-Current treatment: ?Losartan 50mg  one-half tablet daily Appropriate, Effective, Safe, Accessible ?-Medications previously tried: HCTZ 25mg  daily  ?-Current home readings: not checking at home ?-Current dietary habits: tries to eat a lot of fish, cannot eat steak due to esophagus issues.  Trying to limit alcohol ?-Current exercise habits: plays a lot of golf ?-Denies hypotensive/hypertensive symptoms ?-Educated on BP goals and benefits of medications for prevention of heart attack, stroke and kidney damage; ?Exercise goal of 150 minutes per week; ?Symptoms of hypotension and importance of maintaining adequate hydration; ?-Counseled to monitor BP at home as able, document, and provide log at future appointments ?-He mentions a long time dry cough that he has to use cough drops from [facility] for on a daily basis.  Discussed the potential that this could be related to losartan.  However, at this time since the cough is tolerable no changes recommended.  IF cough persists or worsens, we could look to d/c the medication as long as BP remains controlled.   ?-Recommended to continue current medication ? ?Update 03/08/22 ?Continues to take losartan and is still having dry cough.   ?He denies any dizziness at home. He did have a fall a few months back.  Likely just  due to motor issues. ?Chest CT came back negative for malignancy or any reason to be causing the cough. ?He is in [facility] and plans to come in if it is still bothering him when he gets back.  Could be caused by losartan but unlikely due to onset of cough and timing of when he started losartan. ?No changes at this time - continue to monitor BP and adjust as needed. ? ? ?Hyperlipidemia: (LDL goal < 100) ?-Controlled ?-Current treatment: ?Atorvastatin 20mg  daily Appropriate, Effective, Safe, Accessible ?-Medications previously tried: none noted  ?-Current dietary patterns: see above ?-Current exercise habits: see above ?-Educated on Cholesterol  goals;  ?Benefits of statin for ASCVD risk reduction; ?Importance of limiting foods high in cholesterol; ?Does mention some cramping in legs and hands.  Most recent cholesterol in our office was 2020.  However did find updated one from late 2021 which continues to show control. ?-Recommended to continue current medication - monitor myalgias.   ?Could consider addition of CoQ10 for statin induced cramping. ?Also want to make sure vitamin D level is controlled as deficiency is linked to more statin adverse effects. ? ?Update 03/08/22 ?Need updated lipid panel.  Reports continued adherence. ?Denies adverse effects. ?Recommend repeat lipid panel. Last on file from 2020. ?No changes at this time. ? ?Depression/Anxiety (Goal: Minimize symptoms) ?-Controlled ?-Current treatment: ?Citalopram 20mg  daily ?Alprazolam 0.5mg  BID prn as directed ?-Medications previously tried/failed: none noted ?-PHQ9:  ?PHQ9 SCORE ONLY 07/23/2021 03/26/2021 01/24/2020  ?PHQ-9 Total Score 0 0 0  ?Some encounter information is confidential and restricted. Go to Review Flowsheets activity to see all data.  ?-Educated on Benefits of medication for symptom control ?Taking 2- 4 weeks to full effect, do not stop abruptly contact providers as we may want to taper you off this medication. ?He does not feel like he is depressed at this time.  Sleep is good besides occasional trips to the bathroom. ?-Recommended to continue current medication ? ?GERD (Goal: Minimze symptoms) ?-Controlled ?-Current treatment  ?Omperazole 20mg  daily ?-Medications previously tried: none noted  ?-Counseled on appropriate administration 30-60 minutes before meals every morning. ?Does have symptoms when he does not take, reports history of hiatal hernia. ?Continue current medication for now - for best efficacy take daily. ? ?Patient Goals/Self-Care Activities ?Patient will:  ?- take medications as prescribed as evidenced by patient report and record review ?focus on medication  adherence by pill counts ?check blood pressure periodically, document, and provide at future appointments ? ?Follow Up Plan: The care management team will reach out to the patient again over the next 180 days.  ? ? ?  ? ?  ?  ? ?The patient verbalized understanding of instructions, educational materials, and care plan provided today and declined offer to receive copy of patient instructions, educational materials, and care plan.  ?Telephone follow up appointment with pharmacy team member scheduled for: 6 months ? ?Edythe Clarity, [facility] ?Beverly Milch, PharmD ?Clinical Pharmacist  ?[facility] ?[phone] ? ?

## 2022-03-23 DIAGNOSIS — R3915 Urgency of urination: Secondary | ICD-10-CM | POA: Diagnosis not present

## 2022-03-23 DIAGNOSIS — R3914 Feeling of incomplete bladder emptying: Secondary | ICD-10-CM | POA: Diagnosis not present

## 2022-03-29 ENCOUNTER — Other Ambulatory Visit: Payer: Self-pay | Admitting: *Deleted

## 2022-03-29 DIAGNOSIS — L578 Other skin changes due to chronic exposure to nonionizing radiation: Secondary | ICD-10-CM | POA: Diagnosis not present

## 2022-03-29 DIAGNOSIS — B353 Tinea pedis: Secondary | ICD-10-CM | POA: Diagnosis not present

## 2022-03-29 DIAGNOSIS — L814 Other melanin hyperpigmentation: Secondary | ICD-10-CM | POA: Diagnosis not present

## 2022-03-29 DIAGNOSIS — L298 Other pruritus: Secondary | ICD-10-CM | POA: Diagnosis not present

## 2022-03-29 DIAGNOSIS — D492 Neoplasm of unspecified behavior of bone, soft tissue, and skin: Secondary | ICD-10-CM | POA: Diagnosis not present

## 2022-03-29 DIAGNOSIS — L570 Actinic keratosis: Secondary | ICD-10-CM | POA: Diagnosis not present

## 2022-03-29 DIAGNOSIS — C44622 Squamous cell carcinoma of skin of right upper limb, including shoulder: Secondary | ICD-10-CM | POA: Diagnosis not present

## 2022-03-29 DIAGNOSIS — L821 Other seborrheic keratosis: Secondary | ICD-10-CM | POA: Diagnosis not present

## 2022-03-29 DIAGNOSIS — D225 Melanocytic nevi of trunk: Secondary | ICD-10-CM | POA: Diagnosis not present

## 2022-03-29 DIAGNOSIS — L538 Other specified erythematous conditions: Secondary | ICD-10-CM | POA: Diagnosis not present

## 2022-03-29 MED ORDER — CITALOPRAM HYDROBROMIDE 20 MG PO TABS
ORAL_TABLET | ORAL | 2 refills | Status: AC
Start: 2022-03-29 — End: ?

## 2022-03-30 MED ORDER — ALPRAZOLAM 0.5 MG PO TABS: 0.5000 mg | ORAL_TABLET | Freq: Two times a day (BID) | ORAL | 5 refills | Status: DC | PRN

## 2022-04-01 ENCOUNTER — Ambulatory Visit: Payer: Medicare Other

## 2022-04-02 ENCOUNTER — Ambulatory Visit (INDEPENDENT_AMBULATORY_CARE_PROVIDER_SITE_OTHER): Payer: Medicare Other

## 2022-04-02 VITALS — BP 142/84 | HR 98 | Temp 97.9°F | Wt 205.0 lb

## 2022-04-02 DIAGNOSIS — Z0000 Encounter for general adult medical examination without abnormal findings: Secondary | ICD-10-CM | POA: Diagnosis not present

## 2022-04-02 NOTE — Progress Notes (Addendum)
Subjective:   Jesus [name] is a 76 y.o. male who presents for Medicare Annual/Subsequent preventive examination.  Review of Systems     Cardiac Risk Factors include: advanced age (>27men, >14 women);dyslipidemia;hypertension;male gender     Objective:    Today's Vitals   04/02/22 0738  BP: (!) 142/84  Pulse: 98  Temp: 97.9 F (36.6 C)  SpO2: 98%  Weight: 205 lb (93 kg)   Body mass index is 28.59 kg/m.     04/02/2022    7:54 AM 12/24/2021   10:32 AM 03/26/2021    3:17 PM 01/24/2020    8:40 AM  Advanced Directives  Does Patient Have a Medical Advance Directive? Yes Yes Yes Yes  Type of Advance Directive Living will  Living will Living will;Healthcare Power of Attorney  Does patient want to make changes to medical advance directive?  No - Patient declined  No - Patient declined  Copy of [place] in Chart?   No - copy requested No - copy requested    Current Medications (verified) Outpatient Encounter Medications as of 04/02/2022  Medication Sig   acetaminophen (TYLENOL) 500 MG tablet Take 500 mg by mouth every 6 (six) hours as needed.   alfuzosin (UROXATRAL) 10 MG 24 hr tablet Take 10 mg by mouth daily.   ALPRAZolam (XANAX) 0.5 MG tablet Take 1 tablet (0.5 mg total) by mouth 2 (two) times daily as needed.   amoxicillin (AMOXIL) 500 MG capsule Take 500 mg by mouth 3 (three) times daily.   atorvastatin (LIPITOR) 20 MG tablet Take 1 tablet (20 mg total) by mouth daily.   citalopram (CELEXA) 20 MG tablet TAKE 1 TABLET(20 MG) BY MOUTH DAILY   clotrimazole-betamethasone (LOTRISONE) cream Apply topically 2 (two) times daily.   hydrochlorothiazide (HYDRODIURIL) 25 MG tablet Take 25 mg by mouth daily.   ketoconazole (NIZORAL) 2 % cream Apply topically daily.   losartan (COZAAR) 50 MG tablet Take 25mg  (half tablet) dail.   meloxicam (MOBIC) 15 MG tablet Take 15 mg by mouth daily.   metoprolol succinate (TOPROL XL) 25 MG 24 hr tablet Take 1 tablet (25 mg total)  by mouth daily.   Multiple Vitamin (MULTIVITAMIN PO) Take 1 tablet by mouth daily.   Multiple Vitamins-Minerals (ZINC PO) Take 1 tablet by mouth daily.   nystatin cream (MYCOSTATIN) Apply topically 2 (two) times daily.   omeprazole (PRILOSEC) 20 MG capsule TAKE 1 CAPSULE(20 MG) BY MOUTH DAILY   terbinafine (LAMISIL) 250 MG tablet Take 250 mg by mouth daily.   VITAMIN D PO Take 800 Units by mouth.    [DISCONTINUED] B Complex Vitamins (B COMPLEX PO) Take by mouth.   [DISCONTINUED] ibuprofen (ADVIL) 200 MG tablet Take 200 mg by mouth as needed. (Patient not taking: Reported on 03/08/2022)   [DISCONTINUED] Olopatadine HCl (PATADAY) 0.2 % SOLN Apply 1 drop to each eye once daily.   [DISCONTINUED] potassium chloride (KLOR-CON) 20 MEQ packet Take by mouth 2 (two) times daily.   No facility-administered encounter medications on file as of 04/02/2022.    Allergies (verified) Patient has no known allergies.   History: Past Medical History:  Diagnosis Date   Anxiety    Arthritis    Bowel obstruction ([place])    Cancer (HCC)    Colon cancer ([place])    2000   Colon polyp    Depression    Elevated LFTs    Gallstones    GERD (gastroesophageal reflux disease)    Hyperlipidemia  Hypertension    Lung cancer ([place])    2010   Past Surgical History:  Procedure Laterality Date   ANKLE FUSION Left    crushed ankle while in high school   APPENDECTOMY     1960's   CHOLECYSTECTOMY     COLON SURGERY     with reanastomosis   PROSTATE SURGERY     Family History  Problem Relation Age of Onset   Heart disease Father    Breast cancer Sister    Stroke Brother    Breast cancer Daughter    Prostate cancer Neg Hx    Colon cancer Neg Hx    Stomach cancer Neg Hx    Pancreatic cancer Neg Hx    Esophageal cancer Neg Hx    Liver disease Neg Hx    Social History   Socioeconomic History   Marital status: Married    Spouse name: Not on file   Number of children: 1   Years of education: Not on file    Highest education level: Some college, no degree  Occupational History   Occupation: Retired   Tobacco Use   Smoking status: Former    Types: Cigarettes    Quit date: 2010    Years since quitting: 13.4   Smokeless tobacco: Never  Substance and Sexual Activity   Alcohol use: Not Currently    Comment: as of 04/30/21, 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 Previously heavy user   Drug use: Never   Sexual activity: Not Currently  Other Topics Concern   Not on file  Social History Narrative    Retired. Lives part of the time in [place] and is followed by the [facility]. Lives with spouse. Like to play golf and stay active. Playing golf twice a week. Has begun to swim everyday. No ETOH in 3 months. Relationship with spouse has improved.   Social Determinants of Health   Financial Resource Strain: Low Risk  (04/02/2022)   Overall Financial Resource Strain (CARDIA)    Difficulty of Paying Living Expenses: Not hard at all  Food Insecurity: No Food Insecurity (04/02/2022)   Hunger Vital Sign    Worried About Running Out of Food in the Last Year: Never true    Ran Out of Food in the Last Year: Never true  Transportation Needs: No Transportation Needs (04/02/2022)   PRAPARE - Hydrologist (Medical): No    Lack of Transportation (Non-Medical): No  Physical Activity: Insufficiently Active (04/02/2022)   Exercise Vital Sign    Days of Exercise per Week: 2 days    Minutes of Exercise per Session: 20 min  Stress: Stress Concern Present (04/02/2022)   [place]    Feeling of Stress : To some extent  Social Connections: Moderately Isolated (04/02/2022)   Social Connection and Isolation Panel [NHANES]    Frequency of Communication with Friends and Family: More than three times a week    Frequency of Social Gatherings with Friends and Family: More than three times a week    Attends Religious Services: Never     Marine scientist or Organizations: No    Attends Music therapist: Never    Marital Status: Married    Tobacco Counseling Counseling given: Not Answered   Clinical Intake:  Pre-visit preparation completed: Yes  Pain : No/denies pain     BMI - recorded: 28.59 Nutritional Status: BMI 25 -29 Overweight Nutritional  Risks: None Diabetes: No     Diabetic?no  Interpreter Needed?: No  Information entered by :: Charlott Rakes, LPN   Activities of Daily Living    04/02/2022    7:55 AM  In your present state of health, do you have any difficulty performing the following activities:  Hearing? 0  Vision? 0  Difficulty concentrating or making decisions? 0  Walking or climbing stairs? 0  Dressing or bathing? 0  Doing errands, shopping? 0  Preparing Food and eating ? N  Using the Toilet? N  In the past six months, have you accidently leaked urine? Y  Comment wears a brief  Do you have problems with loss of bowel control? Y  Managing your Medications? N  Managing your Finances? N  Housekeeping or managing your Housekeeping? N    Patient Care Team: Vivi Barrack, MD as PCP - General (Family Medicine) Belva Crome, MD as PCP - Cardiology (Cardiology) Laurin Coder, MD as Consulting Physician (Pulmonary Disease) [place], Jennefer Bravo, MD as Consulting Physician (Dermatology) Belva Crome, MD as Consulting Physician (Cardiology) Edythe Clarity, [facility] as Pharmacist (Pharmacist)  Indicate any recent Medical Services you may have received from other than [facility] providers in the past year (date may be approximate).     Assessment:   This is a routine wellness examination for Jesus [name].  Hearing/Vision screen Hearing Screening - Comments:: Pt denies any hearing issues  Vision Screening - Comments:: Encouraged to follow up with Provider   Dietary issues and exercise activities discussed: Current Exercise Habits: Home exercise routine, Type of  exercise: walking, Time (Minutes): 20, Frequency (Times/Week): 2, Weekly Exercise (Minutes/Week): 40   Goals Addressed             This Visit's Progress    Patient Stated       Live long       Depression Screen    04/02/2022    7:52 AM 07/23/2021    1:28 PM 03/26/2021    3:16 PM 02/26/2021    3:44 PM 01/24/2020    8:41 AM 06/29/2019   10:24 AM  PHQ 2/9 Scores  PHQ - 2 Score 2 0 0  0 0  PHQ- 9 Score 7          Information is confidential and restricted. Go to Review Flowsheets to unlock data.    Fall Risk    04/02/2022    7:55 AM 12/15/2021    1:52 PM 09/16/2021    8:50 AM 03/26/2021    3:18 PM 01/24/2020    8:41 AM  Fall Risk   Falls in the past year? 0 1 0 0 1  Number falls in past yr: 0 1  0 1  Injury with Fall? 0 0  0 0  Risk for fall due to : Impaired balance/gait;Impaired mobility;Impaired vision   Impaired balance/gait;Impaired vision;Impaired mobility History of fall(s)  Risk for fall due to: Comment    working with PT   Follow up Falls prevention discussed   Falls prevention discussed Falls evaluation completed;Education provided;Falls prevention discussed    FALL RISK PREVENTION PERTAINING TO THE HOME:  Any stairs in or around the home? Yes  If so, are there any without handrails? No  Home free of loose throw rugs in walkways, pet beds, electrical cords, etc? Yes  Adequate lighting in your home to reduce risk of falls? Yes   ASSISTIVE DEVICES UTILIZED TO PREVENT FALLS:  Life alert? Yes apple watch Use of a  cane, walker or w/c? Yes  Grab bars in the bathroom? Yes  Shower chair or bench in shower? No  Elevated toilet seat or a handicapped toilet? No   TIMED UP AND GO:  Was the test performed? Yes .  Length of time to ambulate 10 feet: 20 sec.   Gait slow and steady without use of assistive device  Cognitive Function:        04/02/2022    7:59 AM 03/26/2021    3:19 PM 01/24/2020    8:41 AM  6CIT Screen  What Year? 0 points 0 points 0 points  What month? 0  points 0 points 0 points  What time? 0 points 0 points 0 points  Count back from 20 0 points 0 points 0 points  Months in reverse 2 points 0 points 0 points  Repeat phrase 2 points 0 points 0 points  Total Score 4 points 0 points 0 points    Immunizations Immunization History  Administered Date(s) Administered   Fluad Quad(high Dose 65+) 07/30/2019, 08/01/2020, 08/31/2021   Influenza, High Dose Seasonal PF 07/30/2019, 08/01/2020, 08/31/2021   Influenza,inj,Quad PF,6+ Mos 11/08/2011   Influenza-Unspecified 10/14/2012, 08/05/2013, 08/16/2017   PFIZER(Purple Top)SARS-COV-2 Vaccination 11/30/2019, 12/25/2019, 07/06/2020   PNEUMOCOCCAL CONJUGATE-20 10/29/2021   Zoster, Live 12/21/2012, 08/05/2013    TDAP status: Due, Education has been provided regarding the importance of this vaccine. Advised may receive this vaccine at local pharmacy or Health Dept. Aware to provide a copy of the vaccination record if obtained from local pharmacy or Health Dept. Verbalized acceptance and understanding.  Flu Vaccine status: Up to date  Pneumococcal vaccine status: Up to date  Covid-19 vaccine status: Completed vaccines  Qualifies for Shingles Vaccine? Yes   Zostavax completed No   Shingrix Completed?: No.    Education has been provided regarding the importance of this vaccine. Patient has been advised to call insurance company to determine out of pocket expense if they have not yet received this vaccine. Advised may also receive vaccine at local pharmacy or Health Dept. Verbalized acceptance and understanding.  Screening Tests Health Maintenance  Topic Date Due   Hepatitis C Screening  Never done   Zoster Vaccines- Shingrix (1 of 2) Never done   COLONOSCOPY (Pts 45-23yrs Insurance coverage will need to be confirmed)  Never done   TETANUS/TDAP  12/15/2022 (Originally 11/24/1964)   INFLUENZA VACCINE  05/25/2022   Pneumonia Vaccine 23+ Years old  Completed   HPV VACCINES  Aged Out   COVID-19 Vaccine   Discontinued    Health Maintenance  Health Maintenance Due  Topic Date Due   Hepatitis C Screening  Never done   Zoster Vaccines- Shingrix (1 of 2) Never done   COLONOSCOPY (Pts 45-19yrs Insurance coverage will need to be confirmed)  Never done    Pt stated colonoscopy completed 09/2021 at [facility] and repeat in 5 years    Additional Screening:  Hepatitis C Screening: does qualify;  Vision Screening: Recommended annual ophthalmology exams for early detection of glaucoma and other disorders of the eye. Is the patient up to date with their annual eye exam?  No  Who is the provider or what is the name of the office in which the patient attends annual eye exams? Encouraged to follow up with provider  If pt is not established with a provider, would they like to be referred to a provider to establish care? No .   Dental Screening: Recommended annual dental exams for proper oral hygiene  Community Resource Referral / Chronic Care Management: CRR required this visit?  No   CCM required this visit?  No      Plan:     I have personally reviewed and noted the following in the patient's chart:   Medical and social history Use of alcohol, tobacco or illicit drugs  Current medications and supplements including opioid prescriptions. Patient is not currently taking opioid prescriptions. Functional ability and status Nutritional status Physical activity Advanced directives List of other physicians Hospitalizations, surgeries, and ER visits in previous 12 months Vitals Screenings to include cognitive, depression, and falls Referrals and appointments  In addition, I have reviewed and discussed with patient certain preventive protocols, quality metrics, and best practice recommendations. A written personalized care plan for preventive services as well as general preventive health recommendations were provided to patient.     Willette Brace, LPN   0/06/300   Nurse  Notes: None

## 2022-04-02 NOTE — Patient Instructions (Signed)
Jesus [name] , Thank you for taking time to come for your Medicare Wellness Visit. I appreciate your ongoing commitment to your health goals. Please review the following plan we discussed and let me know if I can assist you in the future.   Screening recommendations/referrals: Colonoscopy: pt stated repeat in 5 years last done 12/22 per pt  Recommended yearly ophthalmology/optometry visit for glaucoma screening and checkup Recommended yearly dental visit for hygiene and checkup  Vaccinations: Influenza vaccine: Done 08/31/21 repeat every year  Pneumococcal vaccine: Up to date Tdap vaccine: Due and discussed  Shingles vaccine: Shingrix discussed. Please contact your pharmacy for coverage information.    Covid-19: Completed 2/5, 3/2, 07/06/20  Advanced directives: Please bring a copy of your health care power of attorney and living will to the office at your convenience.  Conditions/risks identified: Live long   Next appointment: Follow up in one year for your annual wellness visit.   Preventive Care 75 Years and Older, Male Preventive care refers to lifestyle choices and visits with your health care provider that can promote health and wellness. What does preventive care include? A yearly physical exam. This is also called an annual well check. Dental exams once or twice a year. Routine eye exams. Ask your health care provider how often you should have your eyes checked. Personal lifestyle choices, including: Daily care of your teeth and gums. Regular physical activity. Eating a healthy diet. Avoiding tobacco and drug use. Limiting alcohol use. Practicing safe sex. Taking low doses of aspirin every day. Taking vitamin and mineral supplements as recommended by your health care provider. What happens during an annual well check? The services and screenings done by your health care provider during your annual well check will depend on your age, overall health, lifestyle risk  factors, and family history of disease. Counseling  Your health care provider may ask you questions about your: Alcohol use. Tobacco use. Drug use. Emotional well-being. Home and relationship well-being. Sexual activity. Eating habits. History of falls. Memory and ability to understand (cognition). Work and work Statistician. Screening  You may have the following tests or measurements: Height, weight, and BMI. Blood pressure. Lipid and cholesterol levels. These may be checked every 5 years, or more frequently if you are over 18 years old. Skin check. Lung cancer screening. You may have this screening every year starting at age 71 if you have a 30-pack-year history of smoking and currently smoke or have quit within the past 15 years. Fecal occult blood test (FOBT) of the stool. You may have this test every year starting at age 71. Flexible sigmoidoscopy or colonoscopy. You may have a sigmoidoscopy every 5 years or a colonoscopy every 10 years starting at age 60. Prostate cancer screening. Recommendations will vary depending on your family history and other risks. Hepatitis C blood test. Hepatitis B blood test. Sexually transmitted disease (STD) testing. Diabetes screening. This is done by checking your blood sugar (glucose) after you have not eaten for a while (fasting). You may have this done every 1-3 years. Abdominal aortic aneurysm (AAA) screening. You may need this if you are a current or former smoker. Osteoporosis. You may be screened starting at age 70 if you are at high risk. Talk with your health care provider about your test results, treatment options, and if necessary, the need for more tests. Vaccines  Your health care provider may recommend certain vaccines, such as: Influenza vaccine. This is recommended every year. Tetanus, diphtheria, and acellular pertussis (Tdap, Td)  vaccine. You may need a Td booster every 10 years. Zoster vaccine. You may need this after age  35. Pneumococcal 13-valent conjugate (PCV13) vaccine. One dose is recommended after age 23. Pneumococcal polysaccharide (PPSV23) vaccine. One dose is recommended after age 92. Talk to your health care provider about which screenings and vaccines you need and how often you need them. This information is not intended to replace advice given to you by your health care provider. Make sure you discuss any questions you have with your health care provider. Document Released: 11/07/2015 Document Revised: 06/30/2016 Document Reviewed: 08/12/2015 Elsevier Interactive Patient Education  2017 [place] Prevention in the Home Falls can cause injuries. They can happen to people of all ages. There are many things you can do to make your home safe and to help prevent falls. What can I do on the outside of my home? Regularly fix the edges of walkways and driveways and fix any cracks. Remove anything that might make you trip as you walk through a door, such as a raised step or threshold. Trim any bushes or trees on the path to your home. Use bright outdoor lighting. Clear any walking paths of anything that might make someone trip, such as rocks or tools. Regularly check to see if handrails are loose or broken. Make sure that both sides of any steps have handrails. Any raised decks and porches should have guardrails on the edges. Have any leaves, snow, or ice cleared regularly. Use sand or salt on walking paths during winter. Clean up any spills in your garage right away. This includes oil or grease spills. What can I do in the bathroom? Use night lights. Install grab bars by the toilet and in the tub and shower. Do not use towel bars as grab bars. Use non-skid mats or decals in the tub or shower. If you need to sit down in the shower, use a plastic, non-slip stool. Keep the floor dry. Clean up any water that spills on the floor as soon as it happens. Remove soap buildup in the tub or shower  regularly. Attach bath mats securely with double-sided non-slip rug tape. Do not have throw rugs and other things on the floor that can make you trip. What can I do in the bedroom? Use night lights. Make sure that you have a light by your bed that is easy to reach. Do not use any sheets or blankets that are too big for your bed. They should not hang down onto the floor. Have a firm chair that has side arms. You can use this for support while you get dressed. Do not have throw rugs and other things on the floor that can make you trip. What can I do in the kitchen? Clean up any spills right away. Avoid walking on wet floors. Keep items that you use a lot in easy-to-reach places. If you need to reach something above you, use a strong step stool that has a grab bar. Keep electrical cords out of the way. Do not use floor polish or wax that makes floors slippery. If you must use wax, use non-skid floor wax. Do not have throw rugs and other things on the floor that can make you trip. What can I do with my stairs? Do not leave any items on the stairs. Make sure that there are handrails on both sides of the stairs and use them. Fix handrails that are broken or loose. Make sure that handrails are as long  as the stairways. Check any carpeting to make sure that it is firmly attached to the stairs. Fix any carpet that is loose or worn. Avoid having throw rugs at the top or bottom of the stairs. If you do have throw rugs, attach them to the floor with carpet tape. Make sure that you have a light switch at the top of the stairs and the bottom of the stairs. If you do not have them, ask someone to add them for you. What else can I do to help prevent falls? Wear shoes that: Do not have high heels. Have rubber bottoms. Are comfortable and fit you well. Are closed at the toe. Do not wear sandals. If you use a stepladder: Make sure that it is fully opened. Do not climb a closed stepladder. Make sure that  both sides of the stepladder are locked into place. Ask someone to hold it for you, if possible. Clearly mark and make sure that you can see: Any grab bars or handrails. First and last steps. Where the edge of each step is. Use tools that help you move around (mobility aids) if they are needed. These include: Canes. Walkers. Scooters. Crutches. Turn on the lights when you go into a dark area. Replace any light bulbs as soon as they burn out. Set up your furniture so you have a clear path. Avoid moving your furniture around. If any of your floors are uneven, fix them. If there are any pets around you, be aware of where they are. Review your medicines with your doctor. Some medicines can make you feel dizzy. This can increase your chance of falling. Ask your doctor what other things that you can do to help prevent falls. This information is not intended to replace advice given to you by your health care provider. Make sure you discuss any questions you have with your health care provider. Document Released: 08/07/2009 Document Revised: 03/18/2016 Document Reviewed: 11/15/2014 Elsevier Interactive Patient Education  2017 [employer].

## 2022-04-16 ENCOUNTER — Telehealth: Payer: Self-pay | Admitting: Pharmacist

## 2022-04-23 ENCOUNTER — Encounter: Payer: Medicare Other | Admitting: Family Medicine

## 2022-05-23 ENCOUNTER — Emergency Department: Admit: 2022-05-24 | Payer: MEDICARE

## 2022-05-23 DIAGNOSIS — R55 Syncope and collapse: Secondary | ICD-10-CM

## 2022-05-23 DIAGNOSIS — Z743 Need for continuous supervision: Secondary | ICD-10-CM | POA: Diagnosis not present

## 2022-05-23 DIAGNOSIS — R531 Weakness: Secondary | ICD-10-CM | POA: Diagnosis not present

## 2022-05-23 DIAGNOSIS — R42 Dizziness and giddiness: Secondary | ICD-10-CM | POA: Diagnosis not present

## 2022-05-23 DIAGNOSIS — E860 Dehydration: Secondary | ICD-10-CM | POA: Diagnosis not present

## 2022-05-23 NOTE — ED Triage Notes (Addendum)
Patient arrives via EMS after an unwitnessed syncopal episode a few hours ago. Patient states he was coming back from dinner and went to use the bathroom when he fell forward off the toilet. Per EMS, patient has been weak since the episode, needing assistance to walk. Patient states he has passed out before with the most recent time being a year ago. Patient denies injury or pain at this time. AxO x4.

## 2022-05-23 NOTE — ED Provider Notes (Signed)
Emergency Department Provider Note       PCP: No primary care provider on file.   Age: 76 y.o.   Sex: male     [place]    1. Near syncope  R55       2. Dehydration  E86.0       3. Acute alcoholic intoxication without complication (HCC)  Q59.563       4. Generalized weakness  R53.1           Medical Decision Making     Complexity of Problems Addressed:  1 or more acute illnesses that pose a threat to life or bodily function.     Data Reviewed and Analyzed:  Category 1:   I independently ordered and reviewed each unique test.  I reviewed external records: provider visit note from outside specialist.   The patients assessment required an independent historian: EMS.  The reason they were needed is important historical information not provided by the patient.    Category 2:   I interpreted the X-rays chest x-ray reveals cardiomegaly without evidence of failure or infiltrate.  ______________  ECG interpretation for ECG dated 23 May 2022 at 10:09 PM: ECG reveals a normal sinus rhythm rate of 67 bpm with normal PR and borderline prolonged QT intervals within normal axis.  Borderline ECG.  [name], MD  ------------------------  Category 3: Discussion of management or test interpretation.  76 year old Caucasian male presents with near syncopal episode at home, with generalized weakness.  Patient does admit to having quite a few drinks for lunch, as well as a couple beers for dinner and on review of old records also has a history of some irregular heartbeats based on prior Holter monitor.  All of these can be aggravated by alcohol use.  Patient has been in normal sinus during his stay here in the emergency department, with no chest discomfort or shortness of breath.  Thomas Munoz was hydrated with normal saline and found to be moderately intoxicated with a blood alcohol of 285.  Thomas Munoz had an elevated lactic acid with normal CBC and no evidence of any infection anywhere and no fever.  Vital signs were  stable and I suspect his lactic acidosis is directly related to his alcohol consumption and lack of fluid consumption.  Lactic acid was improving after fluid bolus, and unless the patient has symptoms later of fever or chills or other symptoms of infection, I do not believe any further intervention is necessary at this time.  Patient will be observed in the emergency department, and when I believe by calculation the patient should be down to the alcohol level of 80-125, as long as Thomas Munoz can ambulate without difficulty and can still communicate clearly which Thomas Munoz can now, Thomas Munoz will be discharged home.      Risk of Complications and/or Morbidity of Patient Management:  Chronic medical problems impacting care include history of alcohol abuse as well as history of PNSVT and SVT.  Shared medical decision making was utilized in creating the patients health plan today.    ED Course as of 05/24/22 0134   Sun May 24, 5627   373 76 year old male presented with weakness, near syncope, and Altenol level of 258.  White counts normal with a normal differential, lactic acid elevated but I suspect more likely secondary to dehydration then to infection.  Screening with UA as well as chest x-ray, the patient has no symptoms and will recheck  after fluid bolus.  Do not suspect sepsis at this time. [BB]   Mon May 24, 2022   0110 Lactic acid improving and procalcitonin less than 0.05, highly suggestive that the patient's elevated lactic acid was merely dehydration most likely secondary to alcohol consumption.  I do not feel there is an infection going on at this time, as both urine and chest x-ray were negative. [BB]   0128 Patient with longstanding history of alcohol use.  Based on my calculations, the patient should be fine to be discharged home in the next couple of hours. [BB]      ED Course User Index  [BB] [name], MD       History      Thomas Munoz is a 76 y.o. male who presents to the Emergency Department with chief  complaint of    Chief Complaint   Patient presents with    Loss of Consciousness      76 year old Caucasian male with history of hyperlipidemia, HTN, episodic dizziness, and prior history of lung CA presents to the emergency department complaining of feeling wobbly while Thomas Munoz walked to the bathroom this evening, sat down on the toilet to use the restroom, got more dizzy and fell to the floor.  Called EMS from his watch, and then crawled on the floor to get to the door to let EMS in.  According the patient Thomas Munoz had no headache, visual changes, chest pain, shortness of breath, nausea or vomiting during the episode.  Thomas Munoz had a similar episode about a year and a half ago.  At that time Thomas Munoz was told Thomas Munoz had pneumonia but had no symptoms.  On review of his old record, the patient was seen by cardiology back in March of this year, at that time Thomas Munoz did wear a Holter monitor which revealed several short runs of arrhythmia including SVT, NSVT, and an atrial arrhythmia.  There was noted to be a history of alcohol abuse and Thomas Munoz was highly recommended to cut back on his alcohol use.  Patient does admit today that Thomas Munoz had a fairly heavy lunch with multiple mimosas, went back to the department, and went out to dinner again this evening and had a couple of beers prior to going home and experienced the episode.  Patient is currently living with his daughter in her apartment while Thomas Munoz arranges moved from [place] to [place].        The history is provided by the patient and the EMS personnel.      Review of Systems   Constitutional:  Negative for chills and fever.   Respiratory:  Negative for shortness of breath and wheezing.    Cardiovascular:  Negative for chest pain.   Neurological:  Positive for weakness (Generalized no focal deficits) and light-headedness. Negative for headaches.   All other systems reviewed and are negative.    Physical Exam     Vitals signs and nursing note reviewed:  Vitals:    05/23/22 2309 05/23/22 2319  05/23/22 2329 05/23/22 2339   BP:  108/63 107/69    Pulse: 65 66 70 69   Resp: '16 16 14 16   ' Temp:       TempSrc:       SpO2: 96% 97% 96% 96%   Weight:       Height:          Physical Exam  Vitals and nursing note reviewed.   Constitutional:  General: Thomas Munoz is not in acute distress.  HENT:      Head: Normocephalic and atraumatic.      Right Ear: External ear normal.      Left Ear: External ear normal.      Nose: Nose normal.      Mouth/Throat:      Mouth: Mucous membranes are moist.   Eyes:      Extraocular Movements: Extraocular movements intact.      Conjunctiva/sclera: Conjunctivae normal.      Pupils: Pupils are equal, round, and reactive to light.   Cardiovascular:      Rate and Rhythm: Normal rate and regular rhythm.      Heart sounds: No murmur heard.  Pulmonary:      Effort: Pulmonary effort is normal.      Breath sounds: Normal breath sounds.   Abdominal:      General: Bowel sounds are normal.      Palpations: Abdomen is soft.      Tenderness: There is no abdominal tenderness. There is no right CVA tenderness or left CVA tenderness.   Musculoskeletal:         General: Normal range of motion.      Cervical back: Normal range of motion and neck supple.   Skin:     General: Skin is warm and dry.   Neurological:      General: No focal deficit present.      Mental Status: Thomas Munoz is alert and oriented to person, place, and time.   Psychiatric:         Mood and Affect: Mood normal.         Behavior: Behavior normal.        Procedures     Procedures    Orders Placed This Encounter   Procedures    XR CHEST PORTABLE    CBC with Auto Differential    Comprehensive Metabolic Panel    Magnesium    Troponin    Lactic Acid    Alcohol    Procalcitonin    Lactic Acid    POCT Urine Dipstick    Orthostatic blood pressure and pulse    POCT Urinalysis no Micro    EKG 12 Lead    Insert peripheral IV        Medications   sodium chloride 0.9 % bolus 1,000 mL (0 mLs IntraVENous Stopped 05/23/22 2359)       New Prescriptions    No  medications on file        No past medical history on file.     No past surgical history on file.     Social History     Socioeconomic History    Marital status: Married        Previous Medications    No medications on file        Results Reviewed:      Recent Results (from the past 24 hour(s))   EKG 12 Lead    Collection Time: 05/23/22 10:09 PM   Result Value Ref Range    Ventricular Rate 67 BPM    Atrial Rate 67 BPM    P-R Interval 209 ms    QRS Duration 107 ms    Q-T Interval 464 ms    QTc Calculation (Bazett) 490 ms    P Axis -1 degrees    R Axis 54 degrees    T Axis 40 degrees    Diagnosis Sinus rhythm  Borderline prolonged QT interval  CBC with Auto Differential    Collection Time: 05/23/22 10:30 PM   Result Value Ref Range    WBC 6.4 4.3 - 11.1 K/uL    RBC 4.04 (L) 4.23 - 5.6 M/uL    Hemoglobin 14.0 13.6 - 17.2 g/dL    Hematocrit 41.0 (L) 41.1 - 50.3 %    MCV 101.5 82 - 102 FL    MCH 34.7 (H) 26.1 - 32.9 PG    MCHC 34.1 31.4 - 35.0 g/dL    RDW 13.3 11.9 - 14.6 %    Platelets 144 (L) 150 - 450 K/uL    MPV 9.2 (L) 9.4 - 12.3 FL    nRBC 0.00 0.0 - 0.2 K/uL    Differential Type AUTOMATED      Neutrophils % 58 43 - 78 %    Lymphocytes % 30 13 - 44 %    Monocytes % 8 4.0 - 12.0 %    Eosinophils % 2 0.5 - 7.8 %    Basophils % 1 0.0 - 2.0 %    Immature Granulocytes 1 0.0 - 5.0 %    Neutrophils Absolute 3.8 1.7 - 8.2 K/UL    Lymphocytes Absolute 1.9 0.5 - 4.6 K/UL    Monocytes Absolute 0.5 0.1 - 1.3 K/UL    Eosinophils Absolute 0.1 0.0 - 0.8 K/UL    Basophils Absolute 0.0 0.0 - 0.2 K/UL    Absolute Immature Granulocyte 0.0 0.0 - 0.5 K/UL   Comprehensive Metabolic Panel    Collection Time: 05/23/22 10:30 PM   Result Value Ref Range    Sodium 135 133 - 143 mmol/L    Potassium 4.2 3.5 - 5.1 mmol/L    Chloride 103 101 - 110 mmol/L    CO2 19 (L) 21 - 32 mmol/L    Anion Gap 13 (H) 2 - 11 mmol/L    Glucose 93 65 - 100 mg/dL    BUN 14 8 - 23 MG/DL    Creatinine 0.90 0.8 - 1.5 MG/DL    Est, Glom Filt Rate >60 >60  ml/min/1.57m    Calcium 8.3 8.3 - 10.4 MG/DL    Total Bilirubin 0.4 0.2 - 1.1 MG/DL    ALT 64 12 - 65 U/L    AST 60 (H) 15 - 37 U/L    Alk Phosphatase 64 50 - 136 U/L    Total Protein 6.7 6.3 - 8.2 g/dL    Albumin 3.7 3.2 - 4.6 g/dL    Globulin 3.0 2.8 - 4.5 g/dL    Albumin/Globulin Ratio 1.2 0.4 - 1.6     Magnesium    Collection Time: 05/23/22 10:30 PM   Result Value Ref Range    Magnesium 1.7 (L) 1.8 - 2.4 mg/dL   Troponin    Collection Time: 05/23/22 10:30 PM   Result Value Ref Range    Troponin, High Sensitivity 7.2 0 - 14 pg/mL   Lactic Acid    Collection Time: 05/23/22 10:30 PM   Result Value Ref Range    Lactic Acid, Plasma 5.3 (HH) 0.4 - 2.0 MMOL/L   Alcohol    Collection Time: 05/23/22 10:30 PM   Result Value Ref Range    Ethanol Lvl 258 MG/DL   Procalcitonin    Collection Time: 05/23/22 10:30 PM   Result Value Ref Range    Procalcitonin <0.05 0.00 - 0.49 ng/mL   POCT Urinalysis no Micro    Collection Time: 05/24/22 12:12 AM   Result Value Ref Range  Specific Gravity, Urine, POC 1.015 1.001 - 1.023      pH, Urine, POC 5.5 5.0 - 9.0      Protein, Urine, POC Negative NEG mg/dL    Glucose, UA POC Negative NEG mg/dL    Ketones, Urine, POC Negative NEG mg/dL    Bilirubin, Urine, POC Negative NEG      Blood, UA POC Negative NEG      URINE UROBILINOGEN POC 0.2 0.2 - 1.0 EU/dL    Nitrite, Urine, POC Negative NEG      Leukocyte Est, UA POC Negative NEG      Performed by: [facility]    Lactic Acid    Collection Time: 05/24/22 12:21 AM   Result Value Ref Range    Lactic Acid, Plasma 3.7 (HH) 0.4 - 2.0 MMOL/L     XR CHEST PORTABLE   Final Result   1. Cardiomegaly without evidence of an acute intrathoracic process.                Laverda Sorenson, M.D.    05/24/2022 12:02:00 AM          I discussed the results of all labs, procedures, radiographs, and treatments with the patient and available family.  Treatment plan is agreed upon and the patient is ready for discharge.  All voiced understanding of the discharge plan  and medication instructions or changes as appropriate.  Questions about treatment in the ED were answered.  All were encouraged to return should symptoms worsen or new problems develop.    Voice dictation software was used during the making of this note.  This software is not perfect and grammatical and other typographical errors may be present.  This note has not been completely proofread for errors.     [name], MD  05/24/22 [phone]

## 2022-05-24 ENCOUNTER — Inpatient Hospital Stay
Admit: 2022-05-24 | Discharge: 2022-05-24 | Disposition: A | Discharge status: Home / Self Care | Payer: MEDICARE | Attending: Emergency Medicine

## 2022-05-24 DIAGNOSIS — R55 Syncope and collapse: Secondary | ICD-10-CM | POA: Diagnosis not present

## 2022-05-24 DIAGNOSIS — R279 Unspecified lack of coordination: Secondary | ICD-10-CM | POA: Diagnosis not present

## 2022-05-24 DIAGNOSIS — R42 Dizziness and giddiness: Secondary | ICD-10-CM | POA: Diagnosis not present

## 2022-05-24 LAB — POCT URINALYSIS DIPSTICK
Bilirubin, Urine, POC: NEGATIVE
Blood, UA POC: NEGATIVE
Glucose, UA POC: NEGATIVE mg/dL
Ketones, Urine, POC: NEGATIVE mg/dL
Leukocyte Est, UA POC: NEGATIVE
Nitrite, Urine, POC: NEGATIVE
Protein, Urine, POC: NEGATIVE mg/dL
Specific Gravity, Urine, POC: 1.015 (ref 1.001–1.023)
URINE UROBILINOGEN POC: 0.2 EU/dL (ref 0.2–1.0)
pH, Urine, POC: 5.5 (ref 5.0–9.0)

## 2022-05-24 LAB — CBC WITH AUTO DIFFERENTIAL
Absolute Immature Granulocyte: 0 10*3/uL (ref 0.0–0.5)
Basophils %: 1 % (ref 0.0–2.0)
Basophils Absolute: 0 10*3/uL (ref 0.0–0.2)
Eosinophils %: 2 % (ref 0.5–7.8)
Eosinophils Absolute: 0.1 10*3/uL (ref 0.0–0.8)
Hematocrit: 41 % — ABNORMAL LOW (ref 41.1–50.3)
Hemoglobin: 14 g/dL (ref 13.6–17.2)
Immature Granulocytes: 1 % (ref 0.0–5.0)
Lymphocytes %: 30 % (ref 13–44)
Lymphocytes Absolute: 1.9 10*3/uL (ref 0.5–4.6)
MCH: 34.7 PG — ABNORMAL HIGH (ref 26.1–32.9)
MCHC: 34.1 g/dL (ref 31.4–35.0)
MCV: 101.5 FL (ref 82–102)
MPV: 9.2 FL — ABNORMAL LOW (ref 9.4–12.3)
Monocytes %: 8 % (ref 4.0–12.0)
Monocytes Absolute: 0.5 10*3/uL (ref 0.1–1.3)
Neutrophils %: 58 % (ref 43–78)
Neutrophils Absolute: 3.8 10*3/uL (ref 1.7–8.2)
Platelets: 144 10*3/uL — ABNORMAL LOW (ref 150–450)
RBC: 4.04 M/uL — ABNORMAL LOW (ref 4.23–5.6)
RDW: 13.3 % (ref 11.9–14.6)
WBC: 6.4 10*3/uL (ref 4.3–11.1)
nRBC: 0 10*3/uL (ref 0.0–0.2)

## 2022-05-24 LAB — COMPREHENSIVE METABOLIC PANEL
ALT: 64 U/L (ref 12–65)
AST: 60 U/L — ABNORMAL HIGH (ref 15–37)
Albumin/Globulin Ratio: 1.2 (ref 0.4–1.6)
Albumin: 3.7 g/dL (ref 3.2–4.6)
Alk Phosphatase: 64 U/L (ref 50–136)
Anion Gap: 13 mmol/L — ABNORMAL HIGH (ref 2–11)
BUN: 14 MG/DL (ref 8–23)
CO2: 19 mmol/L — ABNORMAL LOW (ref 21–32)
Calcium: 8.3 MG/DL (ref 8.3–10.4)
Chloride: 103 mmol/L (ref 101–110)
Creatinine: 0.9 MG/DL (ref 0.8–1.5)
Est, Glom Filt Rate: >60 mL/min/{1.73_m2} (ref >60)
Globulin: 3 g/dL (ref 2.8–4.5)
Glucose: 93 mg/dL (ref 65–100)
Potassium: 4.2 mmol/L (ref 3.5–5.1)
Sodium: 135 mmol/L (ref 133–143)
Total Bilirubin: 0.4 MG/DL (ref 0.2–1.1)
Total Protein: 6.7 g/dL (ref 6.3–8.2)

## 2022-05-24 LAB — MAGNESIUM: Magnesium: 1.7 mg/dL — ABNORMAL LOW (ref 1.8–2.4)

## 2022-05-24 LAB — EKG 12-LEAD
Atrial Rate: 67 {beats}/min
P Axis: -1 degrees
P-R Interval: 209 ms
Q-T Interval: 464 ms
QRS Duration: 107 ms
QTc Calculation (Bazett): 490 ms
R Axis: 54 degrees
T Axis: 40 degrees
Ventricular Rate: 67 {beats}/min

## 2022-05-24 LAB — ETHANOL: Ethanol Lvl: 258 MG/DL

## 2022-05-24 LAB — LACTIC ACID
Lactic Acid, Plasma: 5.3 MMOL/L (ref 0.4–2.0)
Lactic Acid, Plasma: 3.7 MMOL/L (ref 0.4–2.0)
Lactic Acid, Plasma: 3.5 MMOL/L — ABNORMAL HIGH (ref 0.4–2.0)

## 2022-05-24 LAB — TROPONIN: Troponin, High Sensitivity: 7.2 pg/mL (ref 0–14)

## 2022-05-24 LAB — PROCALCITONIN: Procalcitonin: <0.05 ng/mL (ref 0.00–0.49)

## 2022-05-24 MED ORDER — SODIUM CHLORIDE 0.9 % IV BOLUS
0.9 % | INTRAVENOUS | Status: AC
Start: 2022-05-24 — End: 2022-05-23
  Administered 2022-05-24: 03:00:00 1000 mL via INTRAVENOUS

## 2022-05-24 MED ORDER — LACTATED RINGERS IV BOLUS
INTRAVENOUS | Status: AC
Start: 2022-05-24 — End: 2022-05-24
  Administered 2022-05-24: 07:00:00 1000 mL via INTRAVENOUS

## 2022-05-24 NOTE — ED Notes (Signed)
I have reviewed discharge instructions with the patient.  The patient verbalized understanding.    Patient left ED via Discharge Method: stretcher to Home with [facility].    Opportunity for questions and clarification provided.       Patient given 0 scripts.         To continue your aftercare when you leave the hospital, you may receive an automated call from our care team to check in on how you are doing.  This is a free service and part of our promise to provide the best care and service to meet your aftercare needs." If you have questions, or wish to unsubscribe from this service please call [phone].  Thank you for Choosing our [facility] Emergency Department.        Daneen Schick, RN  05/24/22 [phone]

## 2022-05-24 NOTE — Discharge Instructions (Addendum)
We would love to help you get a primary care doctor for follow-up after your emergency department visit.    Please call [phone] between 7AM - 6PM Monday to Friday.  A care navigator will be able to assist you with setting up a doctor close to your home.

## 2022-05-25 DIAGNOSIS — R002 Palpitations: Secondary | ICD-10-CM | POA: Diagnosis not present

## 2022-05-25 DIAGNOSIS — C4490 Unspecified malignant neoplasm of skin, unspecified: Secondary | ICD-10-CM | POA: Diagnosis not present

## 2022-05-25 DIAGNOSIS — I10 Essential (primary) hypertension: Secondary | ICD-10-CM | POA: Diagnosis not present

## 2022-05-25 DIAGNOSIS — F1090 Alcohol use, unspecified, uncomplicated: Secondary | ICD-10-CM | POA: Diagnosis not present

## 2022-05-25 DIAGNOSIS — C20 Malignant neoplasm of rectum: Secondary | ICD-10-CM | POA: Diagnosis not present

## 2022-06-14 DIAGNOSIS — Z0000 Encounter for general adult medical examination without abnormal findings: Secondary | ICD-10-CM | POA: Diagnosis not present

## 2022-06-14 DIAGNOSIS — I10 Essential (primary) hypertension: Secondary | ICD-10-CM | POA: Diagnosis not present

## 2022-06-22 DIAGNOSIS — R54 Age-related physical debility: Secondary | ICD-10-CM | POA: Diagnosis not present

## 2022-06-22 DIAGNOSIS — I10 Essential (primary) hypertension: Secondary | ICD-10-CM | POA: Diagnosis not present

## 2022-06-22 DIAGNOSIS — G8929 Other chronic pain: Secondary | ICD-10-CM | POA: Diagnosis not present

## 2022-06-22 DIAGNOSIS — M25551 Pain in right hip: Secondary | ICD-10-CM | POA: Diagnosis not present

## 2022-06-23 DIAGNOSIS — Z961 Presence of intraocular lens: Secondary | ICD-10-CM | POA: Diagnosis not present

## 2022-06-28 ENCOUNTER — Other Ambulatory Visit: Payer: Self-pay | Admitting: Family Medicine

## 2022-07-12 ENCOUNTER — Ambulatory Visit: Payer: Medicare Other | Admitting: Interventional Cardiology

## 2022-07-19 ENCOUNTER — Encounter: Payer: Self-pay | Admitting: *Deleted

## 2022-07-26 IMAGING — DX DG CHEST 2V
2 series · 2 of 2 positions shown · non-contrast
Comparison: 11/19/2019

CLINICAL DATA: Wheezing, history of lung cancer

EXAM:
CHEST - 2 VIEW

[chest pa]
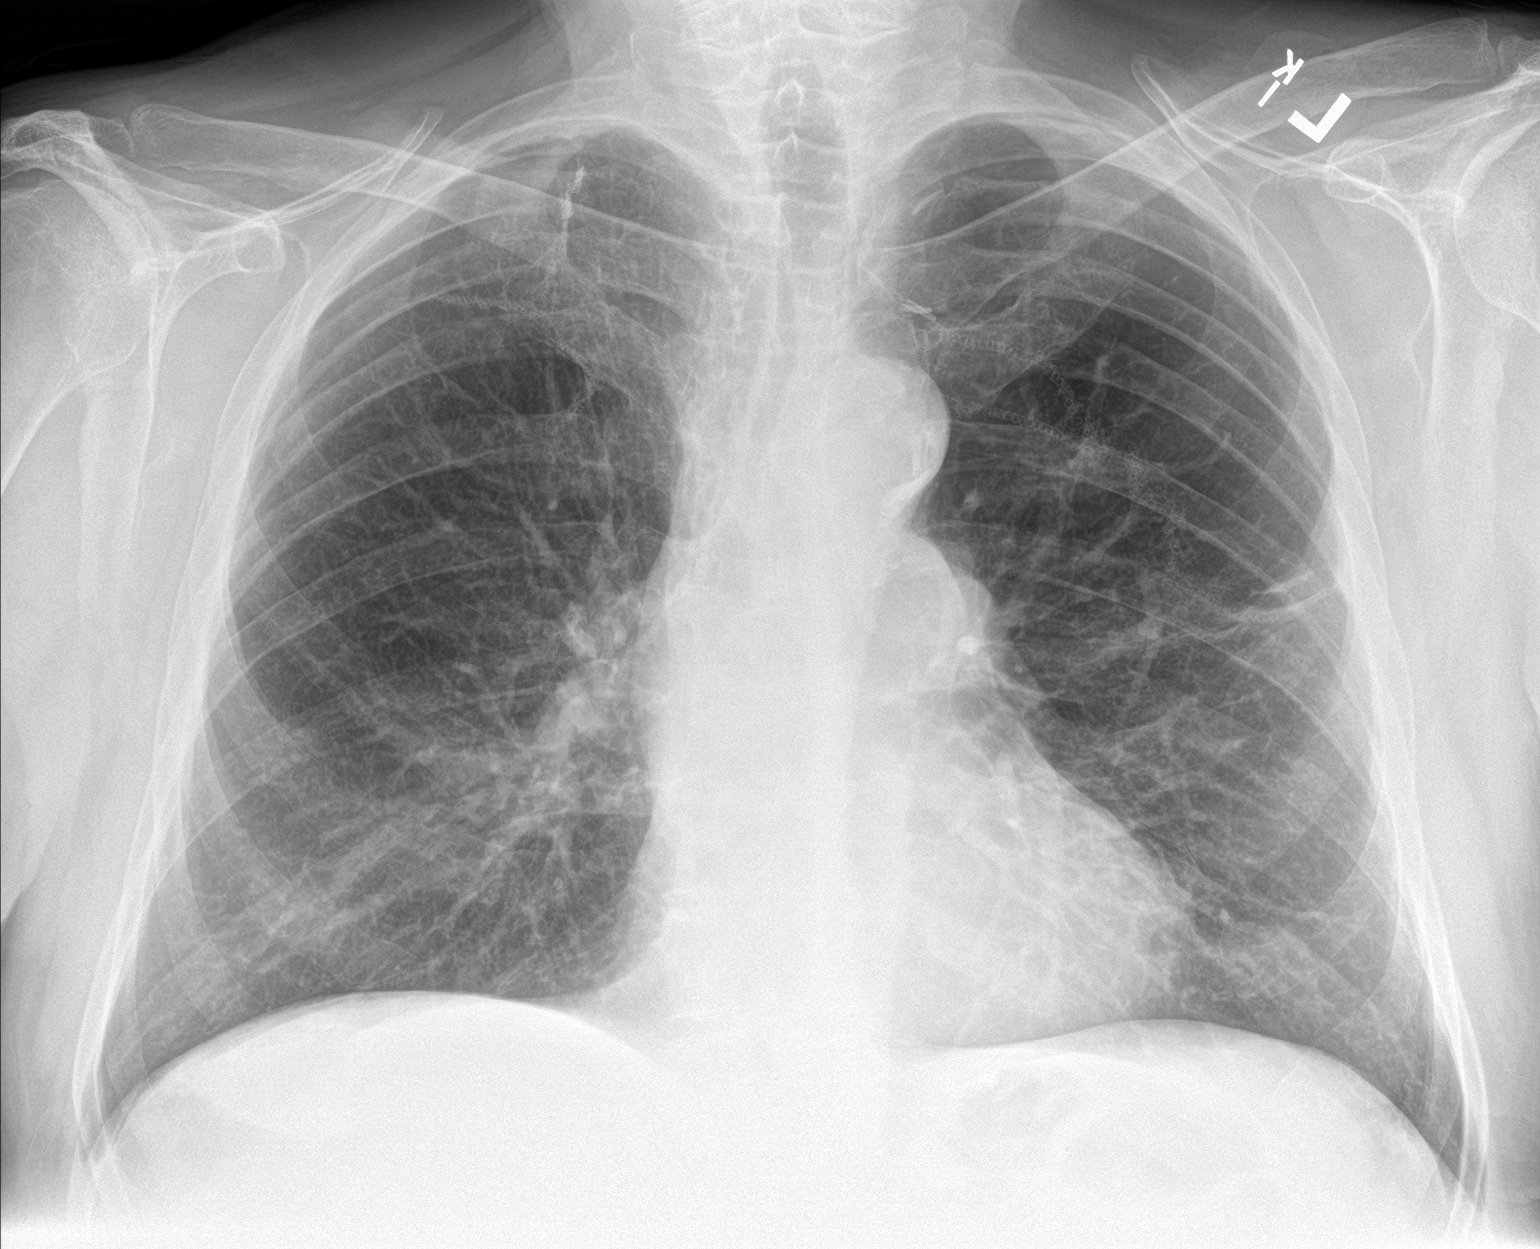

[chest lat]
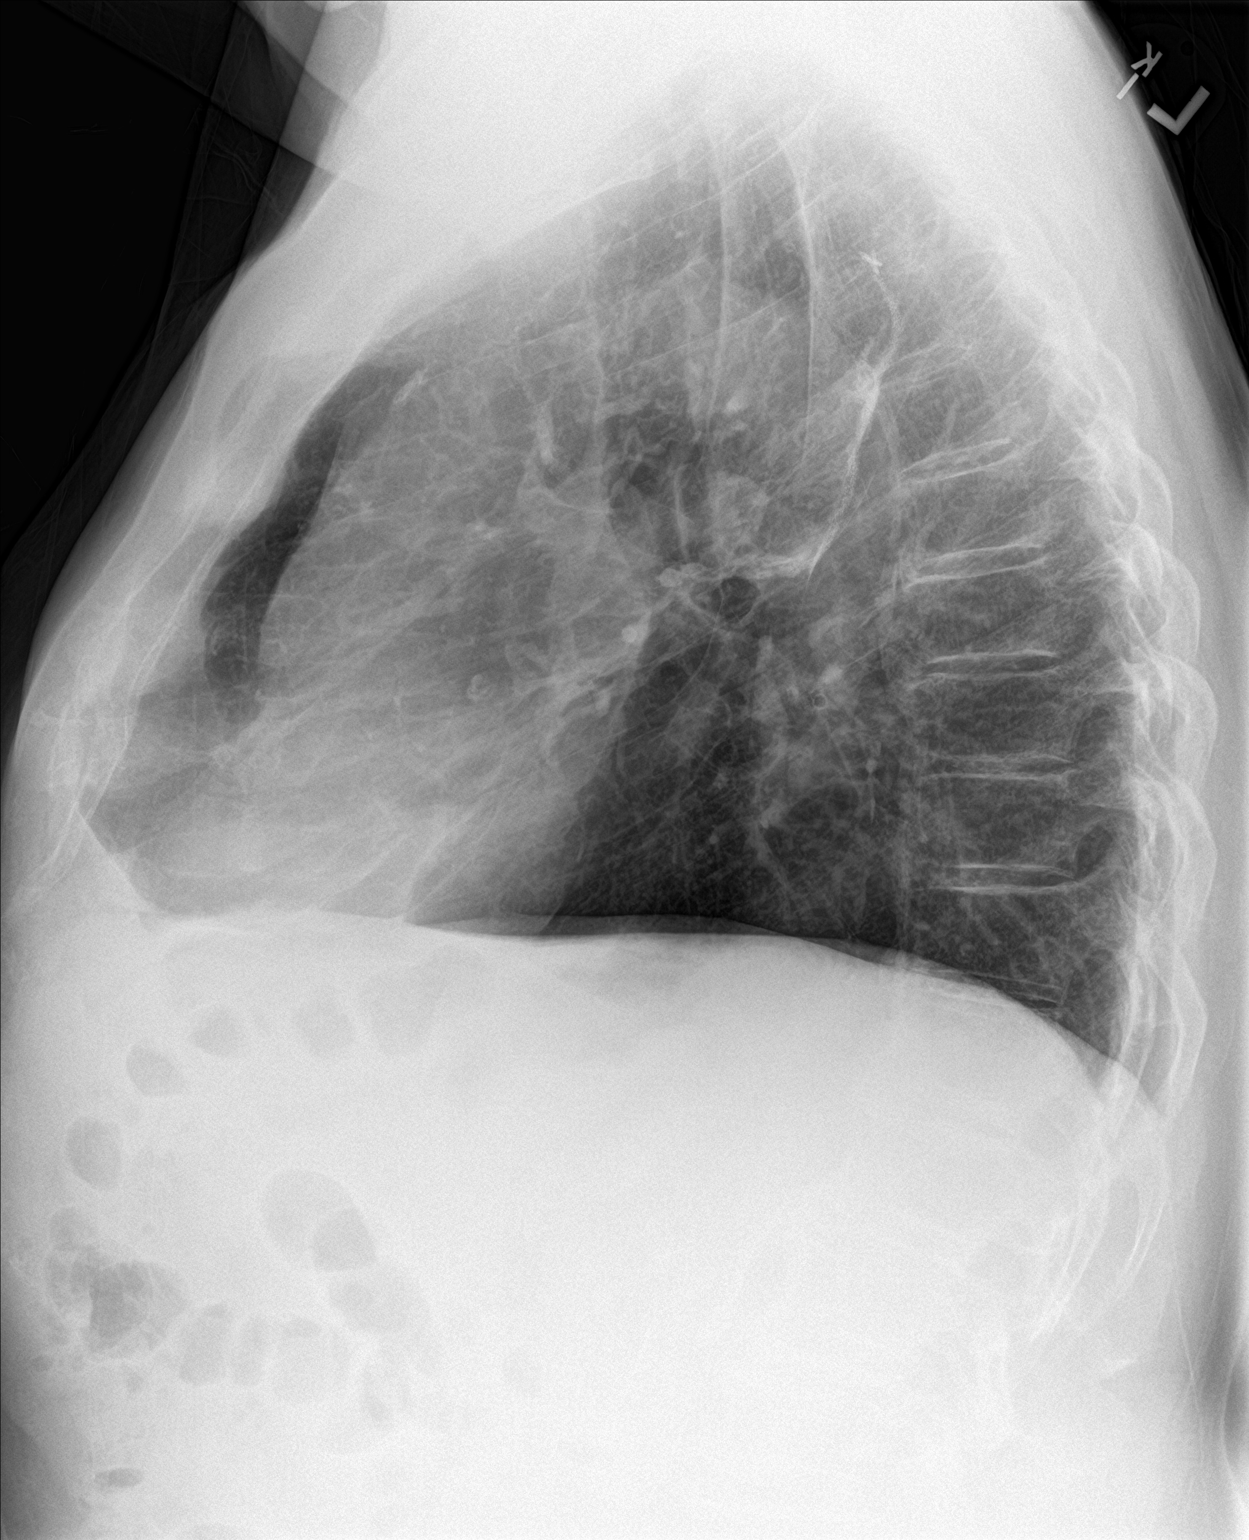

[2 of 2 positions shown; findings below may reference images not displayed]

FINDINGS: Normal heart size and mediastinal contours.

Atherosclerotic calcification aorta.

Prominent LEFT hilum similar to 07/09/2019 exam.

Postsurgical changes of BILATERAL upper lobes with stable linear
LEFT mid lung scarring.

Underlying emphysematous changes.

No acute infiltrate, pleural effusion, or pneumothorax.

Osseous structures unremarkable.
IMPRESSION: Changes of COPD and prior BILATERAL upper lobe surgery.

Stable LEFT upper lobe scarring.

No acute abnormalities.

Aortic Atherosclerosis ([id number]) and Emphysema ([id number]).

## 2022-08-30 NOTE — Progress Notes (Deleted)
Chronic Care Management Pharmacy Note  03/08/2022 Name:  Jesus [name] MRN:  [id number] DOB:  December 14, 1945  Summary: PharmD FU patient still having a cough.  Recommendations/Changes made from today's visit: Monitor cough - could be losartan related  Plan: FU 6 months   Subjective: Jesus [name] is an 76 y.o. year old male who is a primary patient of [name], Algis Greenhouse, MD.  The CCM team was consulted for assistance with disease management and care coordination needs.    Engaged with patient face to face for follow up visit in response to provider referral for pharmacy case management and/or care coordination services.   Consent to Services:  The patient was given the following information about Chronic Care Management services today, agreed to services, and gave verbal consent: 1. CCM service includes personalized support from designated clinical staff supervised by the primary care provider, including individualized plan of care and coordination with other care providers 2. 24/7 contact phone numbers for assistance for urgent and routine care needs. 3. Service will only be billed when office clinical staff spend 20 minutes or more in a month to coordinate care. 4. Only one practitioner may furnish and bill the service in a calendar month. 5.The patient may stop CCM services at any time (effective at the end of the month) by phone call to the office staff. 6. The patient will be responsible for cost sharing (co-pay) of up to 20% of the service fee (after annual deductible is met). Patient agreed to services and consent obtained.  Patient Care Team: Vivi Barrack, MD as PCP - General (Family Medicine) Belva Crome, MD as PCP - Cardiology (Cardiology) Laurin Coder, MD as Consulting Physician (Pulmonary Disease) [place], Jennefer Bravo, MD as Consulting Physician (Dermatology) Belva Crome, MD as Consulting Physician (Cardiology) Edythe Clarity, [facility] as Pharmacist  (Pharmacist)  Recent office visits:  09/16/2021 [name], NP; Olopatadine HCl 0.2% soln for Allergic conjunctivitis of both eyes   Recent consult visits:  None   Hospital visits:  None since last CPP visit  Objective:  Lab Results  Component Value Date   CREATININE 1.03 12/14/2021   BUN 14 12/14/2021   GFR 92.56 02/24/2021   GFRNONAA >60 12/14/2021   GFRAA 107 04/20/2021   NA 137 12/14/2021   K 5.1 12/14/2021   CALCIUM 9.5 12/14/2021   CO2 27 12/14/2021   GLUCOSE 144 (H) 12/14/2021    Lab Results  Component Value Date/Time   HGBA1C 5.3 04/24/2021 03:14 PM   GFR 92.56 02/24/2021 08:42 AM   GFR 89.07 02/19/2021 09:12 AM    Last diabetic Eye exam: No results found for: HMDIABEYEEXA  Last diabetic Foot exam: No results found for: HMDIABFOOTEX   Lab Results  Component Value Date   CHOL 109 06/29/2019   HDL 46.70 06/29/2019   LDLCALC 49 06/29/2019   TRIG 67.0 06/29/2019   CHOLHDL 2 06/29/2019       Latest Ref Rng & Units 12/14/2021    2:52 PM 04/20/2021   12:00 AM 02/19/2021    9:12 AM  Hepatic Function  Total Protein 6.5 - 8.1 g/dL 7.1    7.0    Albumin 3.5 - 5.0 g/dL 4.2   4.2      4.4    AST 15 - 41 U/L 33   17      38    ALT 0 - 44 U/L 25   13      58  Alk Phosphatase 38 - 126 U/L 58   55      60    Total Bilirubin 0.3 - 1.2 mg/dL 1.2    0.9    Bilirubin, Direct 0.0 - 0.2 mg/dL 0.2    0.2       This result is from an external source.    Lab Results  Component Value Date/Time   TSH 0.50 02/11/2021 11:34 AM   TSH 0.81 07/30/2019 10:44 AM   FREET4 0.84 07/30/2019 10:44 AM       Latest Ref Rng & Units 12/14/2021    2:52 PM 04/20/2021   12:00 AM 02/11/2021   11:34 AM  CBC  WBC 4.0 - 10.5 K/uL 9.9   8.0      5.3    Hemoglobin 13.0 - 17.0 g/dL 16.2   14.5      15.1    Hematocrit 39.0 - 52.0 % 48.2   46      44.6    Platelets 150 - 400 K/uL 175   193      161.0       This result is from an external source.    No results found for:  VD25OH  Clinical ASCVD: No  The ASCVD Risk score (Arnett DK, et al., 2019) failed to calculate for the following reasons:   The valid HDL cholesterol range is 0.517 to 2.586 mmol/L   The valid total cholesterol range is 3.362 to 8.275 mmol/L       07/23/2021    1:28 PM 03/26/2021    3:16 PM 02/26/2021    3:44 PM  Depression screen PHQ 2/9  Decreased Interest 0 0   Down, Depressed, Hopeless 0 0   PHQ - 2 Score 0 0      Information is confidential and restricted. Go to Review Flowsheets to unlock data.      Social History   Tobacco Use  Smoking Status Former   Types: Cigarettes   Quit date: 2010   Years since quitting: 13.3  Smokeless Tobacco Never   BP Readings from Last 3 Encounters:  01/13/22 140/76  12/16/21 134/80  12/15/21 121/76   Pulse Readings from Last 3 Encounters:  01/13/22 62  12/16/21 73  12/15/21 83   Wt Readings from Last 3 Encounters:  01/13/22 205 lb (93 kg)  12/16/21 198 lb 12.8 oz (90.2 kg)  12/15/21 203 lb 12.8 oz (92.4 kg)   BMI Readings from Last 3 Encounters:  01/13/22 28.59 kg/m  12/16/21 27.73 kg/m  12/15/21 28.42 kg/m    Assessment/Interventions: Review of patient past medical history, allergies, medications, health status, including review of consultants reports, laboratory and other test data, was performed as part of comprehensive evaluation and provision of chronic care management services.   SDOH:  (Social Determinants of Health) assessments and interventions performed: Yes  Financial Resource Strain: Low Risk    Difficulty of Paying Living Expenses: Not hard at all    SDOH Screenings   Alcohol Screen: Not on file  Depression (PHQ2-9): Low Risk    PHQ-2 Score: 0  Financial Resource Strain: Low Risk    Difficulty of Paying Living Expenses: Not hard at all  Food Insecurity: No Food Insecurity   Worried About Charity fundraiser in the Last Year: Never true   Ran Out of Food in the Last Year: Never true  Housing: Low Risk     Last Housing Risk Score: 0  Physical Activity: Sufficiently Active   Days  of Exercise per Week: 3 days   Minutes of Exercise per Session: 60 min  Social Connections: Moderately Isolated   Frequency of Communication with Friends and Family: More than three times a week   Frequency of Social Gatherings with Friends and Family: More than three times a week   Attends Religious Services: Never   Marine scientist or Organizations: No   Attends Music therapist: Never   Marital Status: Married  Stress: No Stress Concern Present   Feeling of Stress : Not at all  Tobacco Use: Medium Risk   Smoking Tobacco Use: Former   Smokeless Tobacco Use: Never   Passive Exposure: Not on Pensions consultant Needs: No Transportation Needs   Lack of Transportation (Medical): No   Lack of Transportation (Non-Medical): No    CCM Care Plan  No Known Allergies  Medications Reviewed Today     Reviewed by Edythe Clarity, [facility] (Pharmacist) on 03/08/22 at 1445  Med List Status: <None>   Medication Order Taking? Sig Documenting Provider Last Dose Status Informant  acetaminophen (TYLENOL) 500 MG tablet [id number] Yes Take 500 mg by mouth every 6 (six) hours as needed. [provider] Taking Active   ALPRAZolam Duanne Moron) 0.5 MG tablet [id number]  TAKE 1 TABLET BY MOUTH TWICE DAILY AS NEEDED Vivi Barrack, MD  Active   atorvastatin (LIPITOR) 20 MG tablet [id number] Yes Take 1 tablet (20 mg total) by mouth daily. Vivi Barrack, MD Taking Active   B Complex Vitamins (B COMPLEX PO) [id number]  Take by mouth. [provider]  Active   citalopram (CELEXA) 20 MG tablet [id number]  TAKE 1 TABLET(20 MG) BY MOUTH DAILY Vivi Barrack, MD  Active   ibuprofen (ADVIL) 200 MG tablet [id number] No Take 200 mg by mouth as needed.  Patient not taking: Reported on 03/08/2022   [provider] Not Taking Active   losartan (COZAAR) 50 MG tablet [id number]  Take 64m (half tablet) dail.  PVivi Barrack MD  Active   meloxicam ([facility] 15 MG tablet [id number] Take 15 mg by mouth daily. [provider] Taking Active   metoprolol succinate (TOPROL XL) 25 MG 24 hr tablet [id number] Take 1 tablet (25 mg total) by mouth daily. LImogene Burn PA-C  Active   Multiple Vitamin (MULTIVITAMIN PO) [id number] Take 1 tablet by mouth daily. [provider]  Active   Multiple Vitamins-Minerals (ZINC PO) [id number] Take 1 tablet by mouth daily. [provider]  Active   Olopatadine HCl (PATADAY) 0.2 % SOLN [id number] Apply 1 drop to each eye once daily. HJeanie Sewer NP  Active   omeprazole (PRILOSEC) 20 MG capsule [id number] TAKE 1 CAPSULE(20 MG) BY MOUTH DAILY PVivi Barrack MD  Active     Discontinued 10/07/20 1330   VITAMIN D PO [id number] Take 800 Units by mouth.  [provider]  Active             Patient Active Problem List   Diagnosis Date Noted   Actinic keratosis 10/07/2020   Alcohol abuse 04/22/2020   BPH (benign prostatic hyperplasia) s/p TURP 2021 04/22/2020   Other retention of urine 02/10/2020   Aortic aneurysm ([place] 02/10/2020   Sinus tachycardia 02/08/2020   Essential hypertension 06/29/2019   Dyslipidemia 06/29/2019   GERD (gastroesophageal reflux disease) 06/29/2019   Anxiety 06/29/2019   [place] 06/29/2019   [place]  Hypercholesterolemia 03/08/2012   Colon cancer metastasized to lung ([place]) 05/24/2011   Vitamin B12 deficiency 09/30/2010   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    Immunization History  Administered Date(s) Administered   Fluad Quad(high Dose 65+) 07/30/2019, 08/01/2020, 08/31/2021   Influenza, High Dose Seasonal PF 07/30/2019, 08/01/2020   Influenza,inj,Quad PF,6+ Mos 11/08/2011   Influenza-Unspecified 10/14/2012, 08/05/2013, 08/16/2017   PFIZER(Purple Top)SARS-COV-2 Vaccination 11/30/2019, 12/25/2019,  07/06/2020   Zoster, Live 12/21/2012, 08/05/2013    Conditions to be addressed/monitored:  HTN, GERD, HLD, Anxiety,  Care Plan : General Pharmacy (Adult)  Updates made by Edythe Clarity, RPH since 03/08/2022 12:00 AM     Problem: HTN, GERD, HLD, Anxiety,   Priority: High  Onset Date: 02/28/2022     Long-Range Goal: Patient-Specific Goal   Start Date: 08/31/2021  Expected End Date: 02/28/2022  Recent Progress: On track  Priority: High  Note:   Current Barriers:  Possible losartan related cough  Pharmacist Clinical Goal(s):  Patient will maintain control of BP as evidenced by monitoring  through collaboration with PharmD and provider.   Interventions: 1:1 collaboration with Vivi Barrack, MD regarding development and update of comprehensive plan of care as evidenced by provider attestation and co-signature Inter-disciplinary care team collaboration (see longitudinal plan of care) Comprehensive medication review performed; medication list updated in electronic medical record  Hypertension (BP goal <140/90) -Controlled -Current treatment: Losartan 30m one-half tablet daily Appropriate, Effective, Safe, Accessible -Medications previously tried: HCTZ 269mdaily  -Current home readings: not checking at home -Current dietary habits: tries to eat a lot of fish, cannot eat steak due to esophagus issues.  Trying to limit alcohol -Current exercise habits: plays a lot of golf -Denies hypotensive/hypertensive symptoms -Educated on BP goals and benefits of medications for prevention of heart attack, stroke and kidney damage; Exercise goal of 150 minutes per week; Symptoms of hypotension and importance of maintaining adequate hydration; -Counseled to monitor BP at home as able, document, and provide log at future appointments -He mentions a long time dry cough that he has to use cough drops from [facility] for on a daily basis.  Discussed the potential that this could be related to  losartan.  However, at this time since the cough is tolerable no changes recommended.  IF cough persists or worsens, we could look to d/c the medication as long as BP remains controlled.   -Recommended to continue current medication  Update 03/08/22 Continues to take losartan and is still having dry cough.   He denies any dizziness at home. He did have a fall a few months back.  Likely just due to motor issues. Chest CT came back negative for malignancy or any reason to be causing the cough. [place] [facility] [place] it is still bothering him when he gets back.  Could be caused by losartan but unlikely due to onset of cough and timing of when he started losartan. No changes at this time - continue to monitor BP and adjust as needed.   Hyperlipidemia: (LDL goal < 100) -Controlled -Current treatment: Atorvastatin 2052maily Appropriate, Effective, Safe, Accessible -Medications previously tried: none noted  -Current dietary patterns: see above -Current exercise habits: see above -Educated on Cholesterol goals;  Benefits of statin for ASCVD risk reduction; Importance of limiting foods high in cholesterol; Does mention some cramping in legs and hands.  Most recent cholesterol in our office was 2020.  However did find updated one  from late 2021 which continues to show control. -Recommended to continue current medication - monitor myalgias.   Could consider addition of CoQ10 for statin induced cramping. Also want to make sure vitamin D level is controlled as deficiency is linked to more statin adverse effects.  Update 03/08/22 Need updated lipid panel.  Reports continued adherence. Denies adverse effects. Recommend repeat lipid panel. Last on file from 2020. No changes at this time.  Depression/Anxiety (Goal: Minimize symptoms) -Controlled -Current treatment: Citalopram 40m daily Alprazolam 0.545mBID prn as directed -Medications previously tried/failed: none noted -PHQ9:   PHQ9 SCORE ONLY 07/23/2021 03/26/2021 01/24/2020  PHQ-9 Total Score 0 0 0  Some encounter information is confidential and restricted. Go to Review Flowsheets activity to see all data.  -Educated on Benefits of medication for symptom control Taking 2- 4 weeks to full effect, do not stop abruptly contact providers as we may want to taper you off this medication. He does not feel like he is depressed at this time.  Sleep is good besides occasional trips to the bathroom. -Recommended to continue current medication  GERD (Goal: Minimze symptoms) -Controlled -Current treatment  Omperazole 2090maily -Medications previously tried: none noted  -Counseled on appropriate administration 30-60 minutes before meals every morning. Does have symptoms when he does not take, reports history of hiatal hernia. Continue current medication for now - for best efficacy take daily.  Patient Goals/Self-Care Activities Patient will:  - take medications as prescribed as evidenced by patient report and record review focus on medication adherence by pill counts check blood pressure periodically, document, and provide at future appointments  Follow Up Plan: The care management team will reach out to the patient again over the next 180 days.             Medication Assistance: None required.  Patient affirms current coverage meets needs.  Compliance/Adherence/Medication fill history: Care Gaps: Pneumovax Colonoscopy  Star-Rating Drugs: LOSARTAN 50MG TABLETS 07/24/2021 90   ATORVASTATIN 20MG TABLETS 07/24/2021 90    [id number]: [phone]: [phone] [address] [place] [address]one: [phone]:  [phone] pill box? No - prefers bottle Pt endorses 100% compliance  We discussed: Benefits of medication synchronization, packaging and delivery as well as enhanced pharmacist oversight with [employer]. Patient decided to:  Remain with current pharmacy,. [place] Patient Decision:  Patient agrees to Care Plan and Follow-up.  Plan: The care management team will reach out to the patient again over the next 180 days.  ChrBeverly MilchharmD Clinical Pharmacist  [phone] Current Barriers:  Possible losartan related cough  Pharmacist Clinical Goal(s):  Patient will maintain control of BP as evidenced by monitoring  through collaboration with PharmD and provider.   Interventions: 1:1 collaboration with ParVivi BarrackD regarding development and update of comprehensive plan of care as evidenced by provider attestation and co-signature Inter-disciplinary care team collaboration (see longitudinal plan of care) Comprehensive medication review performed; medication list updated in electronic medical record  Hypertension (BP goal <140/90) -Controlled -Current treatment: Losartan 60m14me-half tablet daily Appropriate, Effective, Safe,  Accessible -Medications previously tried: HCTZ 22m daily  -Current home readings: not checking at home -Current dietary habits: tries to eat a lot of fish, cannot eat steak due to esophagus issues.  Trying to limit alcohol -Current exercise habits: plays a lot of golf -Denies hypotensive/hypertensive symptoms -Educated on BP goals and benefits of medications for prevention of heart attack, stroke and kidney damage; Exercise goal of 150 minutes per week; Symptoms of hypotension and importance of maintaining adequate hydration; -Counseled to monitor BP at home as able, document, and provide log at future appointments -He mentions a long time dry cough that he has to  use cough drops from [facility] for on a daily basis.  Discussed the potential that this could be related to losartan.  However, at this time since the cough is tolerable no changes recommended.  IF cough persists or worsens, we could look to d/c the medication as long as BP remains controlled.   -Recommended to continue current medication  Update 03/08/22 Continues to take losartan and is still having dry cough.   He denies any dizziness at home. He did have a fall a few months back.  Likely just due to motor issues. Chest CT came back negative for malignancy or any reason to be causing the cough. [facility] [place] [facility] [place] it is still bothering him when he gets back.  Could be caused by losartan but unlikely due to onset of cough and timing of when he started losartan. No changes at this time - continue to monitor BP and adjust as needed.   Hyperlipidemia: (LDL goal < 100) -Controlled -Current treatment: Atorvastatin 27mdaily Appropriate, Effective, Safe, Accessible -Medications previously tried: none noted  -Current dietary patterns: see above -Current exercise habits: see above -Educated on Cholesterol goals;  Benefits of statin for ASCVD risk reduction; Importance of limiting foods high in cholesterol; Does mention some cramping in legs and hands.  Most recent cholesterol in our office was 2020.  However did find updated one from late 2021 which continues to show control. -Recommended to continue current medication - monitor myalgias.   Could consider addition of CoQ10 for statin induced cramping. Also want to make sure vitamin D level is controlled as deficiency is linked to more statin adverse effects.  Update 03/08/22 Need updated lipid panel.  Reports continued adherence. Denies adverse effects. Recommend repeat lipid panel. Last on file from 2020. No changes at this time.  Depression/Anxiety (Goal: Minimize symptoms) -Controlled -Current treatment: Citalopram  2058maily Alprazolam 0.5mg17mD prn as directed -Medications previously tried/failed: none noted -PHQ9:  PHQ9 SCORE ONLY 07/23/2021 03/26/2021 01/24/2020  PHQ-9 Total Score 0 0 0  Some encounter information is confidential and restricted. Go to Review Flowsheets activity to see all data.  -Educated on Benefits of medication for symptom control Taking 2- 4 weeks to full effect, do not stop abruptly contact providers as we may want to taper you off this medication. He does not feel like he is depressed at this time.  Sleep is good besides occasional trips to the bathroom. -Recommended to continue current medication  GERD (Goal: Minimze symptoms) -Controlled -Current treatment  Omperazole 20mg58mly -Medications previously tried: none noted  -Counseled on appropriate administration 30-60 minutes before meals every morning. Does have symptoms when he does not take, reports history of hiatal hernia. Continue current medication for now - for best efficacy take daily.  Patient Goals/Self-Care Activities Patient will:  - take medications as prescribed as  evidenced by patient report and record review focus on medication adherence by pill counts check blood pressure periodically, document, and provide at future appointments  Follow Up Plan: The care management team will reach out to the patient again over the next 180 days.

## 2022-09-13 ENCOUNTER — Telehealth: Payer: Medicare Other

## 2022-09-27 ENCOUNTER — Other Ambulatory Visit: Payer: Self-pay | Admitting: Family Medicine

## 2022-09-27 ENCOUNTER — Other Ambulatory Visit: Payer: Self-pay | Admitting: Physician Assistant

## 2022-10-07 ENCOUNTER — Encounter: Payer: Self-pay | Admitting: *Deleted

## 2022-12-29 ENCOUNTER — Other Ambulatory Visit: Payer: Self-pay | Admitting: Family Medicine

## 2023-01-09 ENCOUNTER — Other Ambulatory Visit: Payer: Self-pay | Admitting: Family Medicine

## 2023-01-13 ENCOUNTER — Other Ambulatory Visit: Payer: Self-pay | Admitting: Family Medicine

## 2023-01-21 ENCOUNTER — Other Ambulatory Visit: Payer: Self-pay | Admitting: Family Medicine

## 2023-04-25 DEATH — deceased

## 2024-05-30 NOTE — Telephone Encounter (Signed)
 err
